# Patient Record
Sex: Female | Born: 1975 | Race: White | Hispanic: No | Marital: Single | Smoking: Never smoker
Health system: Southern US, Community
[De-identification: ages and names within clinical notes are randomized; demographics above are authoritative.]

## PROBLEM LIST (undated history)

## (undated) DIAGNOSIS — E282 Polycystic ovarian syndrome: Secondary | ICD-10-CM

## (undated) DIAGNOSIS — K219 Gastro-esophageal reflux disease without esophagitis: Secondary | ICD-10-CM

## (undated) DIAGNOSIS — R519 Headache, unspecified: Secondary | ICD-10-CM

## (undated) DIAGNOSIS — T7840XA Allergy, unspecified, initial encounter: Secondary | ICD-10-CM

## (undated) DIAGNOSIS — I959 Hypotension, unspecified: Secondary | ICD-10-CM

## (undated) DIAGNOSIS — R911 Solitary pulmonary nodule: Secondary | ICD-10-CM

## (undated) DIAGNOSIS — R200 Anesthesia of skin: Secondary | ICD-10-CM

## (undated) DIAGNOSIS — I2699 Other pulmonary embolism without acute cor pulmonale: Secondary | ICD-10-CM

## (undated) DIAGNOSIS — R51 Headache: Secondary | ICD-10-CM

## (undated) DIAGNOSIS — F419 Anxiety disorder, unspecified: Secondary | ICD-10-CM

## (undated) DIAGNOSIS — D649 Anemia, unspecified: Secondary | ICD-10-CM

## (undated) DIAGNOSIS — D259 Leiomyoma of uterus, unspecified: Secondary | ICD-10-CM

## (undated) DIAGNOSIS — R202 Paresthesia of skin: Secondary | ICD-10-CM

## (undated) DIAGNOSIS — M25571 Pain in right ankle and joints of right foot: Secondary | ICD-10-CM

## (undated) HISTORY — PX: EYE SURGERY: SHX253

## (undated) HISTORY — DX: Allergy, unspecified, initial encounter: T78.40XA

## (undated) HISTORY — DX: Anxiety disorder, unspecified: F41.9

## (undated) HISTORY — PX: TONSILLECTOMY: SUR1361

## (undated) HISTORY — PX: APPENDECTOMY: SHX54

---

## 2013-02-19 ENCOUNTER — Encounter (INDEPENDENT_AMBULATORY_CARE_PROVIDER_SITE_OTHER): Payer: Self-pay | Admitting: Ophthalmology

## 2013-02-19 DIAGNOSIS — H33309 Unspecified retinal break, unspecified eye: Secondary | ICD-10-CM

## 2013-02-19 DIAGNOSIS — H43819 Vitreous degeneration, unspecified eye: Secondary | ICD-10-CM

## 2013-02-19 DIAGNOSIS — H5210 Myopia, unspecified eye: Secondary | ICD-10-CM

## 2013-02-22 ENCOUNTER — Encounter (INDEPENDENT_AMBULATORY_CARE_PROVIDER_SITE_OTHER): Payer: Self-pay | Admitting: Ophthalmology

## 2013-02-22 DIAGNOSIS — H43819 Vitreous degeneration, unspecified eye: Secondary | ICD-10-CM

## 2013-02-22 DIAGNOSIS — H33309 Unspecified retinal break, unspecified eye: Secondary | ICD-10-CM

## 2013-03-05 ENCOUNTER — Ambulatory Visit (INDEPENDENT_AMBULATORY_CARE_PROVIDER_SITE_OTHER): Payer: Self-pay | Admitting: Ophthalmology

## 2013-06-25 ENCOUNTER — Ambulatory Visit (INDEPENDENT_AMBULATORY_CARE_PROVIDER_SITE_OTHER): Payer: Self-pay | Admitting: Ophthalmology

## 2013-07-13 ENCOUNTER — Ambulatory Visit (INDEPENDENT_AMBULATORY_CARE_PROVIDER_SITE_OTHER): Payer: Self-pay | Admitting: Ophthalmology

## 2013-08-08 ENCOUNTER — Ambulatory Visit (INDEPENDENT_AMBULATORY_CARE_PROVIDER_SITE_OTHER): Payer: BC Managed Care – PPO | Admitting: Ophthalmology

## 2013-08-08 DIAGNOSIS — H43819 Vitreous degeneration, unspecified eye: Secondary | ICD-10-CM

## 2013-08-08 DIAGNOSIS — H5210 Myopia, unspecified eye: Secondary | ICD-10-CM

## 2013-08-08 DIAGNOSIS — H33309 Unspecified retinal break, unspecified eye: Secondary | ICD-10-CM

## 2013-08-20 ENCOUNTER — Ambulatory Visit (INDEPENDENT_AMBULATORY_CARE_PROVIDER_SITE_OTHER): Payer: BC Managed Care – PPO | Admitting: Ophthalmology

## 2013-08-20 DIAGNOSIS — H33309 Unspecified retinal break, unspecified eye: Secondary | ICD-10-CM

## 2014-10-28 ENCOUNTER — Ambulatory Visit (INDEPENDENT_AMBULATORY_CARE_PROVIDER_SITE_OTHER): Payer: Self-pay | Admitting: Family Medicine

## 2014-10-28 ENCOUNTER — Ambulatory Visit (INDEPENDENT_AMBULATORY_CARE_PROVIDER_SITE_OTHER): Payer: Self-pay

## 2014-10-28 VITALS — BP 102/64 | HR 70 | Temp 98.2°F | Resp 16 | Ht 64.0 in | Wt 143.0 lb

## 2014-10-28 DIAGNOSIS — R0602 Shortness of breath: Secondary | ICD-10-CM

## 2014-10-28 MED ORDER — LEVOFLOXACIN 500 MG PO TABS: 500.0000 mg | ORAL_TABLET | Freq: Every day | ORAL | Status: DC

## 2014-10-28 MED ORDER — ALBUTEROL SULFATE HFA 108 (90 BASE) MCG/ACT IN AERS: 2.0000 | INHALATION_SPRAY | Freq: Four times a day (QID) | RESPIRATORY_TRACT | Status: DC | PRN

## 2014-10-28 MED ORDER — DOXYCYCLINE HYCLATE 100 MG PO TABS: 100.0000 mg | ORAL_TABLET | Freq: Two times a day (BID) | ORAL | Status: DC

## 2014-10-28 NOTE — Progress Notes (Signed)
Crystal Spencer is a 38 y.o. female who presents to Urgent Care today with complaints of dyspnea:    States dyspnea started about 2 weeks ago.  In retrospect, states really started about 1 year ago but only worsened roughly 2 weeks.  Dry hacking cough worse at night.  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  No chest pain.  Does endorse some anxiety and decreased appetite, decreased interest in activities.  No SI/HI.  Recently diagnosed with bronchitis after being seen at [facility].  Given 5 days of Azithromycin, finished that about 1 week ago.  PMH reviewed.  Past Medical History  Diagnosis Date  . Allergy   . Anxiety   . Hypertension    Past Surgical History  Procedure Laterality Date  . Eye surgery    . Appendectomy      Medications reviewed. Current Outpatient Prescriptions  Medication Sig Dispense Refill  . norgestimate-ethinyl estradiol (ORTHO-CYCLEN,SPRINTEC,PREVIFEM) 0.25-35 MG-MCG tablet Take 1 tablet by mouth daily.     No current facility-administered medications for this visit.    ROS as above otherwise neg.  No chest pain, palpitations, SOB, Fever, Chills, Abd pain, N/V/D.   Physical Exam:  BP 102/64 mmHg  Pulse 70  Temp(Src) 98.2 F (36.8 C) (Oral)  Resp 16  Ht 5\' 4"  (1.626 m)  Wt 143 lb (64.864 kg)  BMI 24.53 kg/m2  SpO2 100%  LMP 10/06/2014 Gen:  Alert, cooperative patient who appears stated age in no acute distress.  Vital signs reviewed. HEENT: EOMI,  MMM Pulm:  Clear to auscultation  Chest:  TTP along Right serratus anterior muscles.   Cardiac:  Regular rate and rhythm . Abd:  Nontender Ext:  No edema BL legs Psych:  Somewhat anxious appearing.  Denies SI/HI  Exam completed with clinic staff present for entire exam.   [facility] reading (PRIMARY) by  Dr. Mingo Amber:  Question of very faint right opacity RML.  Otherwise no acute cardiopulmonary disease.   Assessment and Plan:  1.  Dyspnea: - think this  is undiagnosed asthma - questionable shadow RML.  Not very likely PNA, but also with persistent cough and some subjective chills.   - treat with doxy.   - inhatler for presumed asthma.  - Needs PFTs  2.  Depression: - think there is likely component of depression as well - discussed this with her - defers treatment until next visit - FU in 2-3 weeks to assess for improvement/change.    j

## 2014-10-28 NOTE — Patient Instructions (Addendum)
Do NOT pick up the Slick.  Take the Doxycycline.  Use the inhaler before bed and every 6 hours if you need it.  It was good to meet you.

## 2014-11-04 ENCOUNTER — Telehealth: Payer: Self-pay | Admitting: General Practice

## 2014-11-04 NOTE — Telephone Encounter (Signed)
-----   Message from Alveda Reasons, MD sent at 10/30/2014  2:21 PM EST ----- Crystal Spencer,  This is a patient I saw at Urgent Care who needs a PCP.  Could you send her a new patient packet and also add my name as her PCP?    Thanks,  JW

## 2015-09-24 ENCOUNTER — Ambulatory Visit (INDEPENDENT_AMBULATORY_CARE_PROVIDER_SITE_OTHER): Payer: Self-pay | Admitting: Family Medicine

## 2015-09-24 VITALS — BP 108/68 | HR 74 | Temp 99.6°F | Resp 16 | Ht 64.0 in | Wt 156.0 lb

## 2015-09-24 DIAGNOSIS — J209 Acute bronchitis, unspecified: Secondary | ICD-10-CM

## 2015-09-24 MED ORDER — AZITHROMYCIN 250 MG PO TABS: ORAL_TABLET | ORAL | Status: DC

## 2015-09-24 MED ORDER — DOXYCYCLINE HYCLATE 100 MG PO TABS: 100.0000 mg | ORAL_TABLET | Freq: Two times a day (BID) | ORAL | Status: DC

## 2015-09-24 MED ORDER — LEVOFLOXACIN 500 MG PO TABS: 500.0000 mg | ORAL_TABLET | Freq: Every day | ORAL | Status: DC

## 2015-09-24 NOTE — Addendum Note (Signed)
Addended by: Robyn Haber on: 09/24/2015 03:22 PM   Modules accepted: Orders

## 2015-09-24 NOTE — Progress Notes (Signed)
@[facility] @  This chart was scribed for Robyn Haber, MD by Thea Alken, ED Scribe. This patient was seen in room 13 and the patient's care was started at 2:36 PM.  Patient ID: Crystal Spencer MRN: [id number], DOB: 1976-04-01, 39 y.o. Date of Encounter: 09/24/2015, 2:36 PM  Primary Physician: No PCP Per Patient  Chief Complaint:  Chief Complaint  Patient presents with   Sore Throat    x 6 days    Fever   Chest Pain    x 1 day, comes and goes    chills and hot    x 6 days    Cough    mucus comes up, yellowish little blood, x 6 days     HPI: 39 y.o. year old female with history below presents with productive cough. Pt states symptoms started 6 days ago with sore throat, fever, chills, severe lower back pain, productive cough and feeling fatigued. She started feeling better after resting the following day but began to have recurrent fever, rattling in right chest with breathing 3 days ago and throat irritation from cough and palpitation last night while laying in bed. She has been using honey herb recola cough drops and nyquil. She has hx of pneumonia, last year, and reports current symptoms to be similar. She has a left over inhaler at home. She denies hx of asthma.   She has been prescribed Levaquin in the past but states this medication is too expensive. She has taken doxycycline in the past but ended up developing an esophageal ulcer, due to not drink enough fluid with medication. She reports most recently while taking amoxicillin she developed a rash.  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  Pt is from [place].  Past Medical History  Diagnosis Date   Allergy    Anxiety    Hypertension      Home Meds: Prior to Admission medications   Medication Sig Start Date End Date Taking? Authorizing Provider  albuterol (PROVENTIL HFA;VENTOLIN HFA) 108 (90 BASE) MCG/ACT inhaler Inhale  2 puffs into the lungs every 6 (six) hours as needed for wheezing or shortness of breath. 10/28/14  Yes Alveda Reasons, MD    Allergies:  Allergies  Allergen Reactions   Amoxicillin Rash    Social History   Social History   Marital Status: Single    Spouse Name: N/A   Number of Children: N/A   Years of Education: N/A   Occupational History   Not on file.   Social History Main Topics   Smoking status: Never Smoker    Smokeless tobacco: Never Used   Alcohol Use: No   Drug Use: No   Sexual Activity: Not on file   Other Topics Concern   Not on file   Social History Narrative     Review of Systems: Constitutional: negative for night sweats, weight changes. HEENT: negative for vision changes, hearing loss, congestion, rhinorrhea, ST, epistaxis, or sinus pressure Cardiovascular: negative for chest pain or palpitations Respiratory: negative for hemoptysis, wheezing, shortness of breath, or cough Abdominal: negative for abdominal pain, nausea, vomiting, diarrhea, or constipation Dermatological: negative for rash Neurologic: negative for headache, dizziness, or syncope All other systems reviewed and are otherwise negative with the exception to those above and in the HPI.   Physical Exam Blood pressure 108/68, pulse 74, temperature 99.6 F (37.6 C), temperature source Oral, resp. rate 16, height 5\' 4"  (  1.626 m), weight 156 lb (70.761 kg), last menstrual period 08/01/2015, SpO2 98 %., Body mass index is 26.76 kg/(m^2). General: Well developed, well nourished, in no acute distress. Head: Normocephalic, atraumatic, eyes without discharge, sclera non-icteric, nares are without discharge. Bilateral auditory canals clear, TM's are without perforation, pearly grey and translucent with reflective cone of light bilaterally. Oral cavity moist, posterior pharynx without exudate, erythema, peritonsillar abscess, or post nasal drip.  Neck: Supple. No thyromegaly. Full ROM. No  lymphadenopathy. Lungs: Rhonchi in right anterior chest. Breathing is unlabored. Heart: RRR with S1 S2. No murmurs, rubs, or gallops appreciated. Abdomen: Soft, non-tender, non-distended with normoactive bowel sounds. No hepatomegaly. No rebound/guarding. No obvious abdominal masses. Msk:  Strength and tone normal for age. Extremities/Skin: Warm and dry. No clubbing or cyanosis. No edema. No rashes or suspicious lesions. Neuro: Alert and oriented X 3. Moves all extremities spontaneously. Gait is normal. CNII-XII grossly in tact. Psych:  Responds to questions appropriately with a normal affect.    ASSESSMENT AND PLAN:  39 y.o. year old female with acute bronchitis vs. Early pneumonia This chart was scribed in my presence and reviewed by me personally.     ICD-9-CM ICD-10-CM   1. Acute bronchitis, unspecified organism 466.0 J20.9 doxycycline (VIBRA-TABS) 100 MG tablet     levofloxacin (LEVAQUIN) 500 MG tablet   By signing my name below, I, Raven Small, attest that this documentation has been prepared under the direction and in the presence of Robyn Haber, MD.  Electronically Signed: Thea Alken, ED Scribe. 09/24/2015. 2:50 PM.  Signed, Robyn Haber, MD 09/24/2015 2:36 PM

## 2015-09-24 NOTE — Patient Instructions (Signed)

## 2016-05-23 ENCOUNTER — Emergency Department (HOSPITAL_COMMUNITY)
Admission: EM | Admit: 2016-05-23 | Discharge: 2016-05-24 | Disposition: A | Discharge status: Home / Self Care | Payer: Self-pay | Attending: Emergency Medicine | Admitting: Emergency Medicine

## 2016-05-23 ENCOUNTER — Emergency Department (HOSPITAL_COMMUNITY): Payer: Self-pay

## 2016-05-23 ENCOUNTER — Encounter (HOSPITAL_COMMUNITY): Payer: Self-pay | Admitting: *Deleted

## 2016-05-23 DIAGNOSIS — R1031 Right lower quadrant pain: Secondary | ICD-10-CM

## 2016-05-23 DIAGNOSIS — I10 Essential (primary) hypertension: Secondary | ICD-10-CM | POA: Insufficient documentation

## 2016-05-23 DIAGNOSIS — D259 Leiomyoma of uterus, unspecified: Secondary | ICD-10-CM | POA: Insufficient documentation

## 2016-05-23 DIAGNOSIS — Z791 Long term (current) use of non-steroidal anti-inflammatories (NSAID): Secondary | ICD-10-CM | POA: Insufficient documentation

## 2016-05-23 DIAGNOSIS — Z79899 Other long term (current) drug therapy: Secondary | ICD-10-CM | POA: Insufficient documentation

## 2016-05-23 LAB — URINALYSIS, ROUTINE W REFLEX MICROSCOPIC
Bilirubin Urine: NEGATIVE
Glucose, UA: NEGATIVE mg/dL
Hgb urine dipstick: NEGATIVE
Ketones, ur: 15 mg/dL — AB
Leukocytes, UA: NEGATIVE
Nitrite: NEGATIVE
PROTEIN: NEGATIVE mg/dL
SPECIFIC GRAVITY, URINE: 1.037 — AB (ref 1.005–1.030)
pH: 6 (ref 5.0–8.0)

## 2016-05-23 LAB — LIPASE, BLOOD: LIPASE: 22 U/L (ref 11–51)

## 2016-05-23 LAB — COMPREHENSIVE METABOLIC PANEL
ALT: 26 U/L (ref 14–54)
ANION GAP: 7 (ref 5–15)
AST: 22 U/L (ref 15–41)
Albumin: 4 g/dL (ref 3.5–5.0)
Alkaline Phosphatase: 80 U/L (ref 38–126)
BUN: 10 mg/dL (ref 6–20)
CHLORIDE: 105 mmol/L (ref 101–111)
CO2: 24 mmol/L (ref 22–32)
CREATININE: 0.53 mg/dL (ref 0.44–1.00)
Calcium: 9.6 mg/dL (ref 8.9–10.3)
Glucose, Bld: 99 mg/dL (ref 65–99)
POTASSIUM: 3.8 mmol/L (ref 3.5–5.1)
Sodium: 136 mmol/L (ref 135–145)
Total Bilirubin: 0.6 mg/dL (ref 0.3–1.2)
Total Protein: 7.9 g/dL (ref 6.5–8.1)

## 2016-05-23 LAB — CBC
HEMATOCRIT: 34.9 % — AB (ref 36.0–46.0)
Hemoglobin: 12.3 g/dL (ref 12.0–15.0)
MCH: 30.1 pg (ref 26.0–34.0)
MCHC: 35.2 g/dL (ref 30.0–36.0)
MCV: 85.5 fL (ref 78.0–100.0)
PLATELETS: 545 10*3/uL — AB (ref 150–400)
RBC: 4.08 MIL/uL (ref 3.87–5.11)
RDW: 12.9 % (ref 11.5–15.5)
WBC: 10.8 10*3/uL — AB (ref 4.0–10.5)

## 2016-05-23 LAB — POC URINE PREG, ED: PREG TEST UR: NEGATIVE

## 2016-05-23 NOTE — ED Notes (Addendum)
Pt complains of right sided abdominal that has gotten progressively worse since Friday. Pt has hx of polycystic ovarian syntdrome. Pt no longer has appendix. Pt last took ibuprofen at 5PM today. Pt denies n/v/d. Pt states she feels a mass around her RLQ. Pt states she has been unable to take medication for her polycystic ovarian syndrome for the past year.

## 2016-05-24 MED ORDER — HYDROCODONE-ACETAMINOPHEN 5-325 MG PO TABS: 1.0000 | ORAL_TABLET | ORAL | Status: DC | PRN

## 2016-05-24 NOTE — ED Provider Notes (Signed)
CSN: [id number]     Arrival date & time 05/23/16  2103 History   First MD Initiated Contact with Patient 05/23/16 2230     Chief Complaint  Patient presents with  . Abdominal Pain     (Consider location/radiation/quality/duration/timing/severity/associated sxs/prior Treatment) HPI Comments: 40 y.o female with history of PCOS presents for abdominal pain and concern for mass.  Patient states that she has noted for sometime that her lower belly has been expanding but she thought she was just gaining weight.  She says that since Friday she has had increasing abdominal discomfort more so on the right side.  Denies fever, chills, nausea, vomiting, diarrhea, constipation.  She does report that she feels that she has to urinate frequently and often only urinates a small amount - she feels her bladder cannot hold much urine.  She also reports that since yesterday she has noted a mass in her lower abdomen.  The patient is supposed to be on medication for her PCOS but is unemployed and uninsured and has not been able to see her doctor or pay for prescriptions.   Past Medical History  Diagnosis Date  . Allergy   . Anxiety   . Hypertension    Past Surgical History  Procedure Laterality Date  . Eye surgery    . Appendectomy     Family History  Problem Relation Age of Onset  . Hyperlipidemia Brother   . Cancer Maternal Grandmother   . Diabetes Maternal Grandmother   . Hearing loss Maternal Grandmother   . Heart disease Maternal Grandfather   . Stroke Maternal Grandfather   . Diabetes Paternal Grandmother   . Stroke Paternal Grandfather   . Mental retardation Paternal Grandfather    Social History  Substance Use Topics  . Smoking status: Never Smoker   . Smokeless tobacco: Never Used  . Alcohol Use: No   OB History    No data available     Review of Systems  Constitutional: Negative for fever, chills and fatigue.  HENT: Negative for congestion and postnasal drip.   Eyes: Negative for  visual disturbance.  Respiratory: Negative for cough and shortness of breath.   Cardiovascular: Negative for chest pain and leg swelling.  Gastrointestinal: Positive for abdominal pain. Negative for nausea, vomiting, diarrhea and constipation.  Genitourinary: Positive for frequency. Negative for dysuria, urgency and hematuria.  Musculoskeletal: Negative for myalgias and back pain.  Skin: Negative for wound.  Neurological: Negative for dizziness, light-headedness and headaches.  Hematological: Does not bruise/bleed easily.      Allergies  Amoxicillin  Home Medications   Prior to Admission medications   Medication Sig Start Date End Date Taking? Authorizing Provider  albuterol (PROVENTIL HFA;VENTOLIN HFA) 108 (90 BASE) MCG/ACT inhaler Inhale 2 puffs into the lungs every 6 (six) hours as needed for wheezing or shortness of breath. 10/28/14  Yes Alveda Reasons, MD  aspirin-sod bicarb-citric acid (ALKA-SELTZER) 325 MG TBEF tablet Take 325 mg by mouth every 6 (six) hours as needed (heart burn).   Yes Historical Provider, MD  ibuprofen (ADVIL,MOTRIN) 400 MG tablet Take 400 mg by mouth every 6 (six) hours as needed.   Yes Historical Provider, MD  loratadine (CLARITIN) 10 MG tablet Take 10 mg by mouth daily.   Yes Historical Provider, MD  Pseudoeph-Doxylamine-DM-APAP (NYQUIL PO) Take 2 capsules by mouth as needed (for sleep).   Yes Historical Provider, MD  azithromycin (ZITHROMAX) 250 MG tablet Take 2 tabs PO x 1 dose, then 1 tab PO QD  x 4 days 09/24/15   Robyn Haber, MD  doxycycline (VIBRA-TABS) 100 MG tablet Take 1 tablet (100 mg total) by mouth 2 (two) times daily. X 7 days 09/24/15   Robyn Haber, MD  levofloxacin (LEVAQUIN) 500 MG tablet Take 1 tablet (500 mg total) by mouth daily. 09/24/15   Robyn Haber, MD   BP 101/58 mmHg  Pulse 71  Temp(Src) 100.3 F (37.9 C) (Oral)  Resp 17  SpO2 97%  LMP 03/20/2016 Physical Exam  Constitutional: She is oriented to person, place,  and time. She appears well-developed and well-nourished. No distress.  HENT:  Head: Normocephalic and atraumatic.  Right Ear: External ear normal.  Left Ear: External ear normal.  Nose: Nose normal.  Mouth/Throat: Oropharynx is clear and moist. No oropharyngeal exudate.  Eyes: EOM are normal. Pupils are equal, round, and reactive to light.  Neck: Normal range of motion. Neck supple.  Cardiovascular: Normal rate, regular rhythm, normal heart sounds and intact distal pulses.   No murmur heard. Pulmonary/Chest: Effort normal. No respiratory distress. She has no wheezes. She has no rales.  Abdominal: Soft. She exhibits mass (in pelvis). She exhibits no distension. There is no tenderness.  Musculoskeletal: Normal range of motion. She exhibits no edema or tenderness.  Neurological: She is alert and oriented to person, place, and time.  Skin: Skin is warm and dry. No rash noted. She is not diaphoretic.  Vitals reviewed.   ED Course  Procedures (including critical care time) Labs Review Labs Reviewed  CBC - Abnormal; Notable for the following:    WBC 10.8 (*)    HCT 34.9 (*)    Platelets 545 (*)    All other components within normal limits  URINALYSIS, ROUTINE W REFLEX MICROSCOPIC (NOT AT [facility]) - Abnormal; Notable for the following:    Color, Urine AMBER (*)    Specific Gravity, Urine 1.037 (*)    Ketones, ur 15 (*)    All other components within normal limits  LIPASE, BLOOD  COMPREHENSIVE METABOLIC PANEL  POC URINE PREG, ED    Imaging Review US Transvaginal Non-ob  05/24/2016  CLINICAL DATA:  Initial evaluation for palpable mass at lower abdomen. Acute right lower quadrant pain. History of prior appendectomy. EXAM: TRANSABDOMINAL AND TRANSVAGINAL ULTRASOUND OF PELVIS TECHNIQUE: Both transabdominal and transvaginal ultrasound examinations of the pelvis were performed. Transabdominal technique was performed for global imaging of the pelvis including uterus, ovaries, adnexal regions,  and pelvic cul-de-sac. It was necessary to proceed with endovaginal exam following the transabdominal exam to visualize the uterus and ovaries. COMPARISON:  None FINDINGS: Uterus Measurements: 18.2 x 7.2 x 9.2 cm. Several fibroids were present. Largest measured 8.0 x 7.5 x 9.1 cm. A second fibroid measured 6.3 x 6.1 x 5.1 cm Endometrium Thickness: 22.6 mm.  No focal abnormality visualized. Right ovary Measurements: 4.9 x 2.3 x 3.7 cm. Normal appearance/no adnexal mass. Left ovary Measurements: 6.7 x 2.7 x 3.3 cm. Normal appearance/no adnexal mass. Other findings Trace free fluid, likely physiologic. IMPRESSION: 1. Enlarged fibroid uterus. 2. No other acute abnormality within the pelvis. Normal sonographic appearance of the ovaries. 3. Trace free fluid within the pelvis, likely physiologic. Electronically Signed   By: Jeannine Boga M.D.   On: 05/24/2016 00:27   US Pelvis Complete  05/24/2016  CLINICAL DATA:  Initial evaluation for palpable mass at lower abdomen. Acute right lower quadrant pain. History of prior appendectomy. EXAM: TRANSABDOMINAL AND TRANSVAGINAL ULTRASOUND OF PELVIS TECHNIQUE: Both transabdominal and transvaginal ultrasound examinations of the  pelvis were performed. Transabdominal technique was performed for global imaging of the pelvis including uterus, ovaries, adnexal regions, and pelvic cul-de-sac. It was necessary to proceed with endovaginal exam following the transabdominal exam to visualize the uterus and ovaries. COMPARISON:  None FINDINGS: Uterus Measurements: 18.2 x 7.2 x 9.2 cm. Several fibroids were present. Largest measured 8.0 x 7.5 x 9.1 cm. A second fibroid measured 6.3 x 6.1 x 5.1 cm Endometrium Thickness: 22.6 mm.  No focal abnormality visualized. Right ovary Measurements: 4.9 x 2.3 x 3.7 cm. Normal appearance/no adnexal mass. Left ovary Measurements: 6.7 x 2.7 x 3.3 cm. Normal appearance/no adnexal mass. Other findings Trace free fluid, likely physiologic. IMPRESSION: 1.  Enlarged fibroid uterus. 2. No other acute abnormality within the pelvis. Normal sonographic appearance of the ovaries. 3. Trace free fluid within the pelvis, likely physiologic. Electronically Signed   By: Jeannine Boga M.D.   On: 05/24/2016 00:27   I have personally reviewed and evaluated these images and lab results as part of my medical decision-making.   EKG Interpretation None      MDM  Patient was seen and evaluated in stable condition. Palpable mass/uterus. Labs unremarkable.  [place] with fibroid uterus.  Patient educated on results and need for OBGYN follow up.  Patient discharged home in stable condition with pain medication. Instruction to use ibuprofen, and obgyn referral. Final diagnoses:  RLQ abdominal pain    1. fibroid uterus    Harvel Quale, MD 05/25/16 [phone]

## 2016-05-24 NOTE — Discharge Instructions (Signed)
You were seen and evaluated tonight for your lower abdominal discomfort and mass. On-year-old for sound that showed that you have a fibroid uterus that is causing these symptoms. You can use Motrin for pain control. Please try to make a follow-up appointment with an OB/GYN.  Uterine Fibroids Uterine fibroids are tissue masses (tumors) that can develop in the womb (uterus). They are also called leiomyomas. This type of tumor is not cancerous (benign) and does not spread to other parts of the body outside of the pelvic area, which is between the hip bones. Occasionally, fibroids may develop in the fallopian tubes, in the cervix, or on the support structures (ligaments) that surround the uterus. You can have one or many fibroids. Fibroids can vary in size, weight, and where they grow in the uterus. Some can become quite large. Most fibroids do not require medical treatment. CAUSES A fibroid can develop when a single uterine cell keeps growing (replicating). Most cells in the human body have a control mechanism that keeps them from replicating without control. SIGNS AND SYMPTOMS Symptoms may include:   Heavy bleeding during your period.  Bleeding or spotting between periods.  Pelvic pain and pressure.  Bladder problems, such as needing to urinate more often (urinary frequency) or urgently.  Inability to reproduce offspring (infertility).  Miscarriages. DIAGNOSIS Uterine fibroids are diagnosed through a physical exam. Your health care provider may feel the lumpy tumors during a pelvic exam. Ultrasonography and an MRI may be done to determine the size, location, and number of fibroids. TREATMENT Treatment may include:  Watchful waiting. This involves getting the fibroid checked by your health care provider to see if it grows or shrinks. Follow your health care provider's recommendations for how often to have this checked.  Hormone medicines. These can be taken by mouth or given through an  intrauterine device (IUD).  Surgery.  Removing the fibroids (myomectomy) or the uterus (hysterectomy).  Removing blood supply to the fibroids (uterine artery embolization). If fibroids interfere with your fertility and you want to become pregnant, your health care provider may recommend having the fibroids removed.  HOME CARE INSTRUCTIONS  Keep all follow-up visits as directed by your health care provider. This is important.  Take medicines only as directed by your health care provider.  If you were prescribed a hormone treatment, take the hormone medicines exactly as directed.  Do not take aspirin, because it can cause bleeding.  Ask your health care provider about taking iron pills and increasing the amount of dark green, leafy vegetables in your diet. These actions can help to boost your blood iron levels, which may be affected by heavy menstrual bleeding.  Pay close attention to your period and tell your health care provider about any changes, such as:  Increased blood flow that requires you to use more pads or tampons than usual per month.  A change in the number of days that your period lasts per month.  A change in symptoms that are associated with your period, such as abdominal cramping or back pain. SEEK MEDICAL CARE IF:  You have pelvic pain, back pain, or abdominal cramps that cannot be controlled with medicines.  You have an increase in bleeding between and during periods.  You soak tampons or pads in a half hour or less.  You feel lightheaded, extra tired, or weak. SEEK IMMEDIATE MEDICAL CARE IF:  You faint.  You have a sudden increase in pelvic pain.   This information is not intended to replace  advice given to you by your health care provider. Make sure you discuss any questions you have with your health care provider.   Document Released: 11/19/2000 Document Revised: 12/13/2014 Document Reviewed: 05/21/2014 Elsevier Interactive Patient Education [employer]

## 2016-06-03 ENCOUNTER — Encounter: Payer: Self-pay | Admitting: Obstetrics & Gynecology

## 2016-06-03 ENCOUNTER — Ambulatory Visit (INDEPENDENT_AMBULATORY_CARE_PROVIDER_SITE_OTHER): Payer: Self-pay | Admitting: Obstetrics & Gynecology

## 2016-06-03 VITALS — BP 104/60 | HR 72 | Ht 64.0 in | Wt 159.0 lb

## 2016-06-03 DIAGNOSIS — D259 Leiomyoma of uterus, unspecified: Secondary | ICD-10-CM

## 2016-06-03 NOTE — Patient Instructions (Addendum)
Uterine Fibroids Uterine fibroids are tissue masses (tumors) that can develop in the womb (uterus). They are also called leiomyomas. This type of tumor is not cancerous (benign) and does not spread to other parts of the body outside of the pelvic area, which is between the hip bones. Occasionally, fibroids may develop in the fallopian tubes, in the cervix, or on the support structures (ligaments) that surround the uterus. You can have one or many fibroids. Fibroids can vary in size, weight, and where they grow in the uterus. Some can become quite large. Most fibroids do not require medical treatment. CAUSES A fibroid can develop when a single uterine cell keeps growing (replicating). Most cells in the human body have a control mechanism that keeps them from replicating without control. SIGNS AND SYMPTOMS Symptoms may include:   Heavy bleeding during your period.  Bleeding or spotting between periods.  Pelvic pain and pressure.  Bladder problems, such as needing to urinate more often (urinary frequency) or urgently.  Inability to reproduce offspring (infertility).  Miscarriages. DIAGNOSIS Uterine fibroids are diagnosed through a physical exam. Your health care provider may feel the lumpy tumors during a pelvic exam. Ultrasonography and an MRI may be done to determine the size, location, and number of fibroids. TREATMENT Treatment may include:  Watchful waiting. This involves getting the fibroid checked by your health care provider to see if it grows or shrinks. Follow your health care provider's recommendations for how often to have this checked.  Hormone medicines. These can be taken by mouth or given through an intrauterine device (IUD).  Surgery.  Removing the fibroids (myomectomy) or the uterus (hysterectomy).  Removing blood supply to the fibroids (uterine artery embolization). If fibroids interfere with your fertility and you want to become pregnant, your health care provider  may recommend having the fibroids removed.  HOME CARE INSTRUCTIONS  Keep all follow-up visits as directed by your health care provider. This is important.  Take medicines only as directed by your health care provider.  If you were prescribed a hormone treatment, take the hormone medicines exactly as directed.  Do not take aspirin, because it can cause bleeding.  Ask your health care provider about taking iron pills and increasing the amount of dark green, leafy vegetables in your diet. These actions can help to boost your blood iron levels, which may be affected by heavy menstrual bleeding.  Pay close attention to your period and tell your health care provider about any changes, such as:  Increased blood flow that requires you to use more pads or tampons than usual per month.  A change in the number of days that your period lasts per month.  A change in symptoms that are associated with your period, such as abdominal cramping or back pain. SEEK MEDICAL CARE IF:  You have pelvic pain, back pain, or abdominal cramps that cannot be controlled with medicines.  You have an increase in bleeding between and during periods.  You soak tampons or pads in a half hour or less.  You feel lightheaded, extra tired, or weak. SEEK IMMEDIATE MEDICAL CARE IF:  You faint.  You have a sudden increase in pelvic pain.   This information is not intended to replace advice given to you by your health care provider. Make sure you discuss any questions you have with your health care provider.   Document Released: 11/19/2000 Document Revised: 12/13/2014 Document Reviewed: 05/21/2014 Elsevier Interactive Patient Education 2016 Elsevier Inc.  

## 2016-06-03 NOTE — Progress Notes (Signed)
Patient ID: Crystal Spencer, female   DOB: 1975-12-27, 40 y.o.   MRN: [id number] History:  40 y.o. G0. LMP here today for Pt was referred for eval of uterine fibroids.  Pt was begin seen for PCOS by her doc in [place], Dr. Narda Bonds.  Pt reports that she has noted that her abd is growing.  She was having pain and was seen in the ED and she was noted to have uterine fibroids. She was seen at the [facility] and was given OPCs and it has helped with the pain. Pt reports very large clots with her cycle.  Pt denies bleeding between cycles.  Pt reports that she has no desire to conceive.    The following portions of the patient's history were reviewed and updated as appropriate: allergies, current medications, past family history, past medical history, past social history, past surgical history and problem list.  Review of Systems:  Pertinent items are noted in HPI.  Objective:  Physical Exam Last menstrual period 03/20/2016. Gen: NAD Abd: Soft, nontender and nondistended Pelvic: Normal appearing external genitalia; normal appearing vaginal mucosa and cervix.  Normal discharge.  Enlarged uterus 18-20 weeks sized.  Mobile.  No other palpable masses but, exam limited by mass.  Labs and Imaging US Transvaginal Non-ob  05/24/2016  CLINICAL DATA:  Initial evaluation for palpable mass at lower abdomen. Acute right lower quadrant pain. History of prior appendectomy. EXAM: TRANSABDOMINAL AND TRANSVAGINAL ULTRASOUND OF PELVIS TECHNIQUE: Both transabdominal and transvaginal ultrasound examinations of the pelvis were performed. Transabdominal technique was performed for global imaging of the pelvis including uterus, ovaries, adnexal regions, and pelvic cul-de-sac. It was necessary to proceed with endovaginal exam following the transabdominal exam to visualize the uterus and ovaries. COMPARISON:  None FINDINGS: Uterus Measurements: 18.2 x 7.2 x 9.2 cm. Several fibroids were present. Largest measured 8.0 x 7.5 x 9.1 cm. A  second fibroid measured 6.3 x 6.1 x 5.1 cm Endometrium Thickness: 22.6 mm.  No focal abnormality visualized. Right ovary Measurements: 4.9 x 2.3 x 3.7 cm. Normal appearance/no adnexal mass. Left ovary Measurements: 6.7 x 2.7 x 3.3 cm. Normal appearance/no adnexal mass. Other findings Trace free fluid, likely physiologic. IMPRESSION: 1. Enlarged fibroid uterus. 2. No other acute abnormality within the pelvis. Normal sonographic appearance of the ovaries. 3. Trace free fluid within the pelvis, likely physiologic. Electronically Signed   By: Jeannine Boga M.D.   On: 05/24/2016 00:27   US Pelvis Complete  05/24/2016  CLINICAL DATA:  Initial evaluation for palpable mass at lower abdomen. Acute right lower quadrant pain. History of prior appendectomy. EXAM: TRANSABDOMINAL AND TRANSVAGINAL ULTRASOUND OF PELVIS TECHNIQUE: Both transabdominal and transvaginal ultrasound examinations of the pelvis were performed. Transabdominal technique was performed for global imaging of the pelvis including uterus, ovaries, adnexal regions, and pelvic cul-de-sac. It was necessary to proceed with endovaginal exam following the transabdominal exam to visualize the uterus and ovaries. COMPARISON:  None FINDINGS: Uterus Measurements: 18.2 x 7.2 x 9.2 cm. Several fibroids were present. Largest measured 8.0 x 7.5 x 9.1 cm. A second fibroid measured 6.3 x 6.1 x 5.1 cm Endometrium Thickness: 22.6 mm.  No focal abnormality visualized. Right ovary Measurements: 4.9 x 2.3 x 3.7 cm. Normal appearance/no adnexal mass. Left ovary Measurements: 6.7 x 2.7 x 3.3 cm. Normal appearance/no adnexal mass. Other findings Trace free fluid, likely physiologic. IMPRESSION: 1. Enlarged fibroid uterus. 2. No other acute abnormality within the pelvis. Normal sonographic appearance of the ovaries. 3. Trace free fluid within the pelvis,  likely physiologic. Electronically Signed   By: Jeannine Boga M.D.   On: 05/24/2016 00:27    Assessment & Plan:   Uterine fibroids.  18-20 weeks sized.  Reviewed treatment options and pt wants surgery.  Want to review prev records to see how rapidly these are enlarging.  Pt does not report a h/o uterine fiborids but was followed prev in [place] and reports a sono 2 years prev   Keep OCPs  Records Dr. Narda Bonds in [place] and [place]  Complete financial aid paperwork  F/u 4-6 weeks or sooner prn   Dasani Thurlow L. Harraway-Smith, M.D., Cherlynn June

## 2016-06-04 ENCOUNTER — Encounter: Payer: Self-pay | Admitting: Obstetrics & Gynecology

## 2016-12-08 ENCOUNTER — Encounter: Payer: Self-pay | Admitting: Obstetrics & Gynecology

## 2016-12-08 ENCOUNTER — Ambulatory Visit (INDEPENDENT_AMBULATORY_CARE_PROVIDER_SITE_OTHER): Payer: BLUE CROSS/BLUE SHIELD | Admitting: Obstetrics & Gynecology

## 2016-12-08 VITALS — BP 114/67 | HR 73 | Ht 64.0 in | Wt 160.4 lb

## 2016-12-08 DIAGNOSIS — D251 Intramural leiomyoma of uterus: Secondary | ICD-10-CM

## 2016-12-08 DIAGNOSIS — D250 Submucous leiomyoma of uterus: Secondary | ICD-10-CM | POA: Diagnosis not present

## 2016-12-08 NOTE — Patient Instructions (Addendum)
Total Laparoscopic Hysterectomy A total laparoscopic hysterectomy is a minimally invasive surgery to remove your uterus and cervix. This surgery is performed by making several small cuts (incisions) in your abdomen. It can also be done with a thin, lighted tube (laparoscope) inserted into two small incisions in your lower abdomen. Your fallopian tubes and ovaries can be removed (bilateral salpingo-oophorectomy) during this surgery as well.Benefits of minimally invasive surgery include:  Less pain.  Less risk of blood loss.  Less risk of infection.  Quicker return to normal activities.  Tell a health care provider about:  Any allergies you have.  All medicines you are taking, including vitamins, herbs, eye drops, creams, and over-the-counter medicines.  Any problems you or family members have had with anesthetic medicines.  Any blood disorders you have.  Any surgeries you have had.  Any medical conditions you have. What are the risks? Generally, this is a safe procedure. However, as with any procedure, complications can occur. Possible complications include:  Bleeding.  Blood clots in the legs or lung.  Infection.  Injury to surrounding organs.  Problems with anesthesia.  Early menopause symptoms (hot flashes, night sweats, insomnia).  Risk of conversion to an open abdominal incision.  What happens before the procedure?  Ask your health care provider about changing or stopping your regular medicines.  Do not take aspirin or blood thinners (anticoagulants) for 1 week before the surgery or as told by your health care provider.  Do not eat or drink anything for 8 hours before the surgery or as told by your health care provider.  Quit smoking if you smoke.  Arrange for a ride home after surgery and for someone to help you at home during recovery. What happens during the procedure?  You will be given antibiotic medicine.  An IV tube will be placed in your arm. You  will be given medicine to make you sleep (general anesthetic).  A gas (carbon dioxide) will be used to inflate your abdomen. This will allow your surgeon to look inside your abdomen, perform your surgery, and treat any other problems found if necessary.  Three or four small incisions (often less than 1/2 inch) will be made in your abdomen. One of these incisions will be made in the area of your belly button (navel). The laparoscope will be inserted into the incision. Your surgeon will look through the laparoscope while doing your procedure.  Other surgical instruments will be inserted through the other incisions.  Your uterus may be removed through your vagina or cut into small pieces and removed through the small incisions.  Your incisions will be closed. What happens after the procedure?  The gas will be released from inside your abdomen.  You will be taken to the recovery area where a nurse will watch and check your progress. Once you are awake, stable, and taking fluids well, without other problems, you will return to your room or be allowed to go home.  There is usually minimal discomfort following the surgery because the incisions are so small.  You will be given pain medicine while you are in the hospital and for when you go home. This information is not intended to replace advice given to you by your health care provider. Make sure you discuss any questions you have with your health care provider. Document Released: 09/19/2007 Document Revised: 04/29/2016 Document Reviewed: 06/12/2013 Elsevier Interactive Patient Education  2017 Elsevier Inc. Total Laparoscopic Hysterectomy, Care After Refer to this sheet in the next few   weeks. These instructions provide you with information on caring for yourself after your procedure. Your health care provider may also give you more specific instructions. Your treatment has been planned according to current medical practices, but problems sometimes  occur. Call your health care provider if you have any problems or questions after your procedure. What can I expect after the procedure?  Pain and bruising at the incision sites. You will be given pain medicine to control it.  Menopausal symptoms such as hot flashes, night sweats, and insomnia if your ovaries were removed.  Sore throat from the breathing tube that was inserted during surgery. Follow these instructions at home:  Only take over-the-counter or prescription medicines for pain, discomfort, or fever as directed by your health care provider.  Do not take aspirin. It can cause bleeding.  Do not drive when taking pain medicine.  Follow your health care provider's advice regarding diet, exercise, lifting, driving, and general activities.  Resume your usual diet as directed and allowed.  Get plenty of rest and sleep.  Do not douche, use tampons, or have sexual intercourse for at least 6 weeks, or until your health care provider gives you permission.  Change your bandages (dressings) as directed by your health care provider.  Monitor your temperature and notify your health care provider of a fever.  Take showers instead of baths for 2-3 weeks.  Do not drink alcohol until your health care provider gives you permission.  If you develop constipation, you may take a mild laxative with your health care provider's permission. Bran foods may help with constipation problems. Drinking enough fluids to keep your urine clear or pale yellow may help as well.  Try to have someone home with you for 1-2 weeks to help around the house.  Keep all of your follow-up appointments as directed by your health care provider. Contact a health care provider if:  You have swelling, redness, or increasing pain around your incision sites.  You have pus coming from your incision.  You notice a bad smell coming from your incision.  Your incision breaks open.  You feel dizzy or  lightheaded.  You have pain or bleeding when you urinate.  You have persistent diarrhea.  You have persistent nausea and vomiting.  You have abnormal vaginal discharge.  You have a rash.  You have any type of abnormal reaction or develop an allergy to your medicine.  You have poor pain control with your prescribed medicine. Get help right away if:  You have chest pain or shortness of breath.  You have severe abdominal pain that is not relieved with pain medicine.  You have pain or swelling in your legs. This information is not intended to replace advice given to you by your health care provider. Make sure you discuss any questions you have with your health care provider. Document Released: 09/12/2013 Document Revised: 04/29/2016 Document Reviewed: 06/12/2013 Elsevier Interactive Patient Education  2017 Elsevier Inc.  

## 2016-12-08 NOTE — Progress Notes (Signed)
History:  41 y.o. G0P0000 here today for discussion of management of fibroid uterus. Pt reports continued pressure and bleeding She reports that the bleeding is better on OCPs. She also c/o pressure on her bladder and freq urination due to the fibroids. NO dysuria or hematuria. Pt requests definitive tx with a hysterectomy. She reports that she is not interested in a myomectomy as she does not want children after age 71 and she is not in a relationship at present.  She wants to maintain her ovaries. She has a new job as a Surveyor, [employer] of 2 girls. She does no lifting at work presently.    The following portions of the patient's history were reviewed and updated as appropriate: allergies, current medications, past family history, past medical history, past social history, past surgical history and problem list.  Review of Systems:  Pertinent items are noted in HPI.   Objective:  Physical Exam Blood pressure 114/67, pulse 73, height 5\' 4"  (1.626 m), weight 160 lb 6.4 oz (72.8 kg), last menstrual period 12/04/2016. BP 114/67   Pulse 73   Ht 5\' 4"  (1.626 m)   Wt 160 lb 6.4 oz (72.8 kg)   LMP 12/04/2016 (Exact Date)   BMI 27.53 kg/m  CONSTITUTIONAL: Well-developed, well-nourished female in no acute distress.  HENT:  Normocephalic, atraumatic EYES: Conjunctivae and EOM are normal. No scleral icterus.  NECK: Normal range of motion SKIN: Skin is warm and dry. No rash noted. Not diaphoretic.No pallor. [place]: Alert and oriented to person, place, and time. Normal coordination.  Abd: Soft, nontender and nondistended; Fibroid uterus to 2 cm below umbilicus. Pelvic: deferred  Labs and Imaging 05/24/2016 CLINICAL DATA:  Initial evaluation for palpable mass at lower abdomen. Acute right lower quadrant pain. History of prior appendectomy.  EXAM: TRANSABDOMINAL AND TRANSVAGINAL ULTRASOUND OF PELVIS  TECHNIQUE: Both transabdominal and transvaginal ultrasound examinations of the pelvis were performed.  Transabdominal technique was performed for global imaging of the pelvis including uterus, ovaries, adnexal regions, and pelvic cul-de-sac. It was necessary to proceed with endovaginal exam following the transabdominal exam to visualize the uterus and ovaries.  COMPARISON:  None  FINDINGS: Uterus  Measurements: 18.2 x 7.2 x 9.2 cm. Several fibroids were present. Largest measured 8.0 x 7.5 x 9.1 cm. A second fibroid measured 6.3 x 6.1 x 5.1 cm  Endometrium  Thickness: 22.6 mm.  No focal abnormality visualized.  Right ovary  Measurements: 4.9 x 2.3 x 3.7 cm. Normal appearance/no adnexal mass.  Left ovary  Measurements: 6.7 x 2.7 x 3.3 cm. Normal appearance/no adnexal mass.  Other findings  Trace free fluid, likely physiologic.  IMPRESSION: 1. Enlarged fibroid uterus. 2. No other acute abnormality within the pelvis. Normal sonographic appearance of the ovaries. 3. Trace free fluid within the pelvis, likely physiologic.  Assessment & Plan:  Large symptomatic uterine fibroids  Patient desires surgical management with robot assisted totla laparoscopic hysterectomy with bilateral salpingectomy.  The risks of surgery were discussed in detail with the patient including but not limited to: bleeding which may require transfusion or reoperation; infection which may require prolonged hospitalization or re-hospitalization and antibiotic therapy; injury to bowel, bladder, ureters and major vessels or other surrounding organs; need for additional procedures including laparotomy; thromboembolic phenomenon, incisional problems and other postoperative or anesthesia complications.  Patient was told that the likelihood that her condition and symptoms will be treated effectively with this surgical management was very high; the postoperative expectations were also discussed in detail. The patient also understands the  alternative treatment options which were discussed in full. All  questions were answered.  She was told that she will be contacted by our surgical scheduler regarding the time and date of her surgery; routine preoperative instructions of having nothing to eat or drink after midnight on the day prior to surgery and also coming to the hospital 1 1/2 hours prior to her time of surgery were also emphasized.  She was told she may be called for a preoperative appointment about a week prior to surgery and will be given further preoperative instructions at that visit. Printed patient education handouts about the procedure were given to the patient to review at home. Pt wants surgery AFTER Feb 5th  Jalisia Puchalski L. Harraway-Smith, M.D., Cherlynn June

## 2016-12-10 ENCOUNTER — Encounter (HOSPITAL_COMMUNITY): Payer: Self-pay | Admitting: *Deleted

## 2016-12-29 ENCOUNTER — Emergency Department (HOSPITAL_COMMUNITY): Payer: BLUE CROSS/BLUE SHIELD

## 2016-12-29 ENCOUNTER — Encounter (HOSPITAL_COMMUNITY): Payer: Self-pay | Admitting: *Deleted

## 2016-12-29 ENCOUNTER — Inpatient Hospital Stay (HOSPITAL_COMMUNITY)
Admission: EM | Admit: 2016-12-29 | Discharge: 2016-12-31 | DRG: 175 | Disposition: A | Discharge status: Home / Self Care | Payer: BLUE CROSS/BLUE SHIELD | Attending: Internal Medicine | Admitting: Internal Medicine

## 2016-12-29 DIAGNOSIS — I2699 Other pulmonary embolism without acute cor pulmonale: Secondary | ICD-10-CM

## 2016-12-29 DIAGNOSIS — J329 Chronic sinusitis, unspecified: Secondary | ICD-10-CM | POA: Diagnosis present

## 2016-12-29 DIAGNOSIS — D259 Leiomyoma of uterus, unspecified: Secondary | ICD-10-CM | POA: Diagnosis not present

## 2016-12-29 DIAGNOSIS — R0602 Shortness of breath: Secondary | ICD-10-CM | POA: Diagnosis not present

## 2016-12-29 DIAGNOSIS — F411 Generalized anxiety disorder: Secondary | ICD-10-CM | POA: Diagnosis not present

## 2016-12-29 DIAGNOSIS — I82491 Acute embolism and thrombosis of other specified deep vein of right lower extremity: Secondary | ICD-10-CM | POA: Diagnosis present

## 2016-12-29 DIAGNOSIS — Z881 Allergy status to other antibiotic agents status: Secondary | ICD-10-CM

## 2016-12-29 DIAGNOSIS — F419 Anxiety disorder, unspecified: Secondary | ICD-10-CM | POA: Diagnosis present

## 2016-12-29 DIAGNOSIS — I2609 Other pulmonary embolism with acute cor pulmonale: Secondary | ICD-10-CM

## 2016-12-29 DIAGNOSIS — R0789 Other chest pain: Secondary | ICD-10-CM | POA: Diagnosis not present

## 2016-12-29 DIAGNOSIS — Z885 Allergy status to narcotic agent status: Secondary | ICD-10-CM

## 2016-12-29 DIAGNOSIS — I82401 Acute embolism and thrombosis of unspecified deep veins of right lower extremity: Secondary | ICD-10-CM | POA: Diagnosis not present

## 2016-12-29 DIAGNOSIS — E282 Polycystic ovarian syndrome: Secondary | ICD-10-CM | POA: Diagnosis not present

## 2016-12-29 DIAGNOSIS — Z793 Long term (current) use of hormonal contraceptives: Secondary | ICD-10-CM

## 2016-12-29 DIAGNOSIS — R9431 Abnormal electrocardiogram [ECG] [EKG]: Secondary | ICD-10-CM | POA: Diagnosis not present

## 2016-12-29 DIAGNOSIS — I824Z1 Acute embolism and thrombosis of unspecified deep veins of right distal lower extremity: Secondary | ICD-10-CM | POA: Diagnosis not present

## 2016-12-29 HISTORY — DX: Other pulmonary embolism without acute cor pulmonale: I26.99

## 2016-12-29 HISTORY — DX: Leiomyoma of uterus, unspecified: D25.9

## 2016-12-29 HISTORY — DX: Polycystic ovarian syndrome: E28.2

## 2016-12-29 LAB — I-STAT TROPONIN, ED
TROPONIN I, POC: 0.01 ng/mL (ref 0.00–0.08)
Troponin i, poc: 0 ng/mL (ref 0.00–0.08)

## 2016-12-29 LAB — BASIC METABOLIC PANEL
ANION GAP: 7 (ref 5–15)
BUN: 6 mg/dL (ref 6–20)
CALCIUM: 9.2 mg/dL (ref 8.9–10.3)
CO2: 23 mmol/L (ref 22–32)
Chloride: 108 mmol/L (ref 101–111)
Creatinine, Ser: 0.62 mg/dL (ref 0.44–1.00)
GFR calc Af Amer: >60 mL/min (ref >60)
GLUCOSE: 95 mg/dL (ref 65–99)
Potassium: 3.9 mmol/L (ref 3.5–5.1)
Sodium: 138 mmol/L (ref 135–145)

## 2016-12-29 LAB — CBC
HCT: 34.2 % — ABNORMAL LOW (ref 36.0–46.0)
HEMOGLOBIN: 11.5 g/dL — AB (ref 12.0–15.0)
MCH: 28 pg (ref 26.0–34.0)
MCHC: 33.6 g/dL (ref 30.0–36.0)
MCV: 83.2 fL (ref 78.0–100.0)
Platelets: 381 10*3/uL (ref 150–400)
RBC: 4.11 MIL/uL (ref 3.87–5.11)
RDW: 14.5 % (ref 11.5–15.5)
WBC: 10.7 10*3/uL — ABNORMAL HIGH (ref 4.0–10.5)

## 2016-12-29 MED ORDER — HEPARIN (PORCINE) IN NACL 100-0.45 UNIT/ML-% IJ SOLN
1450.0000 [IU]/h | INTRAMUSCULAR | Status: DC
  Administered 2016-12-29: 1200 [IU]/h via INTRAVENOUS
  Administered 2016-12-30: 1450 [IU]/h via INTRAVENOUS
  Filled 2016-12-29 (×3): qty 250

## 2016-12-29 MED ORDER — IOPAMIDOL (ISOVUE-370) INJECTION 76%
INTRAVENOUS | Status: AC
  Administered 2016-12-29: 100 mL
  Filled 2016-12-29: qty 100

## 2016-12-29 MED ORDER — HEPARIN BOLUS VIA INFUSION
4000.0000 [IU] | Freq: Once | INTRAVENOUS | Status: AC
  Administered 2016-12-29: 4000 [IU] via INTRAVENOUS
  Filled 2016-12-29: qty 4000

## 2016-12-29 NOTE — Progress Notes (Signed)
ANTICOAGULATION CONSULT NOTE - Initial Consult  Pharmacy Consult for heparin Indication: pulmonary embolus  Allergies  Allergen Reactions  . Hydrocodone Nausea And Vomiting  . Amoxicillin Rash    Patient Measurements: Height: 5\' 4"  (162.6 cm) Weight: 160 lb (72.6 kg) IBW/kg (Calculated) : 54.7 Heparin Dosing Weight: 70kg  Vital Signs: Temp: 99.2 F (37.3 C) (01/24 1551) Temp Source: Oral (01/24 1551) BP: 119/83 (01/24 2200) Pulse Rate: 88 (01/24 2200)  Labs:  Recent Labs  12/29/16 1554  HGB 11.5*  HCT 34.2*  PLT 381  CREATININE 0.62    Estimated Creatinine Clearance: 92.3 mL/min (by C-G formula based on SCr of 0.62 mg/dL).   Medical History: Past Medical History:  Diagnosis Date  . Allergy   . Anxiety   . Polycystic ovarian syndrome   . Uterine fibroid     Medications:  Infusions:  . heparin      Assessment: 57 yof presented to the ED with SOB and chest tightness. CT demonstrated bilateral PE with right heart strain. Baseline H/H is slightly low and platelets are WNL. She is not on anticoagulation PTA.   Goal of Therapy:  Heparin level 0.3-0.7 units/ml Monitor platelets by anticoagulation protocol: Yes   Plan:  Heparin bolus 4000 units IV x 1 Heparin gtt 1200 units/hr Check a 6 hr heparin level Daily heparin level and CBC F/u plans for long-term anticoagulation.  Kysen Wetherington, Rande Lawman 12/29/2016,10:28 PM

## 2016-12-29 NOTE — ED Notes (Signed)
Patient transported to CT 

## 2016-12-29 NOTE — H&P (Signed)
History and Physical    Crystal Spencer [id number] DOB: 20-Jun-1976 DOA: 12/29/2016   PCP: No PCP Per Patient Chief Complaint:  Chief Complaint  Patient presents with  . Shortness of Breath    HPI: Crystal Spencer is a 41 y.o. female with medical history significant of PCOS, uterine fibroid.  Patient on OCP.  Patient presents to the ED with 4 day history of SOB and chest tightness.  Symptoms worsening.  No recent travel but has had RLE pain recently.  Nothing tried for symptoms, nothing makes better or worse.  Went to UC for symptoms and got sent to ED for abnormal EKG.  ED Course: EKG shows S1, Q3, QAVF, but upright T waves in lead 3.  Still with 2x negative troponins this was enough for EDP to perform CTA PE study which was positive for PE with R heart strain.  Review of Systems: As per HPI otherwise 10 point review of systems negative.    Past Medical History:  Diagnosis Date  . Allergy   . Anxiety   . Polycystic ovarian syndrome   . Uterine fibroid     Past Surgical History:  Procedure Laterality Date  . APPENDECTOMY    . EYE SURGERY       reports that she has never smoked. She has never used smokeless tobacco. She reports that she drinks alcohol. She reports that she does not use drugs.  Allergies  Allergen Reactions  . Hydrocodone Nausea And Vomiting  . Amoxicillin Rash    Family History  Problem Relation Age of Onset  . Hyperlipidemia Brother   . Cancer Maternal Grandmother   . Diabetes Maternal Grandmother   . Hearing loss Maternal Grandmother   . Heart disease Maternal Grandfather   . Stroke Maternal Grandfather   . Diabetes Paternal Grandmother   . Stroke Paternal Grandfather   . Mental retardation Paternal Grandfather       Prior to Admission medications   Medication Sig Start Date End Date Taking? Authorizing Provider  albuterol (PROVENTIL HFA;VENTOLIN HFA) 108 (90 BASE) MCG/ACT inhaler Inhale 2 puffs into the lungs every 6 (six) hours as needed  for wheezing or shortness of breath. 10/28/14  Yes Alveda Reasons, MD  ibuprofen (ADVIL,MOTRIN) 400 MG tablet Take 400 mg by mouth every 6 (six) hours as needed.   Yes Historical Provider, MD  loratadine (CLARITIN) 10 MG tablet Take 10 mg by mouth daily.   Yes Historical Provider, MD  Norgestimate-Ethinyl Estradiol Triphasic (ORTHO TRI-CYCLEN, 28,) 0.18/0.215/0.25 MG-35 MCG tablet Take 1 tablet by mouth daily.   Yes Historical Provider, MD    Physical Exam: Vitals:   12/29/16 2200 12/29/16 2230 12/29/16 2300 12/29/16 2330  BP: 119/83 117/90 115/91 119/87  Pulse: 88 91 100 91  Resp: 15 22 23 23   Temp:      TempSrc:      SpO2: 97% 97% 95% 96%  Weight:      Height:          Constitutional: NAD, calm, comfortable Eyes: PERRL, lids and conjunctivae normal ENMT: Mucous membranes are moist. Posterior pharynx clear of any exudate or lesions.Normal dentition.  Neck: normal, supple, no masses, no thyromegaly Respiratory: clear to auscultation bilaterally, no wheezing, no crackles. Normal respiratory effort. No accessory muscle use.  Cardiovascular: Regular rate and rhythm, no murmurs / rubs / gallops. No extremity edema. 2+ pedal pulses. No carotid bruits.  Abdomen: no tenderness, no masses palpated. No hepatosplenomegaly. Bowel sounds positive.  Musculoskeletal: no clubbing / cyanosis.  No joint deformity upper and lower extremities. Good ROM, no contractures. Normal muscle tone.  Skin: no rashes, lesions, ulcers. No induration Neurologic: CN 2-12 grossly intact. Sensation intact, DTR normal. Strength 5/5 in all 4.  Psychiatric: Normal judgment and insight. Alert and oriented x 3. Normal mood.    Labs on Admission: I have personally reviewed following labs and imaging studies  CBC:  Recent Labs Lab 12/29/16 1554  WBC 10.7*  HGB 11.5*  HCT 34.2*  MCV 83.2  PLT 123XX123   Basic Metabolic Panel:  Recent Labs Lab 12/29/16 1554  NA 138  K 3.9  CL 108  CO2 23  GLUCOSE 95  BUN 6    CREATININE 0.62  CALCIUM 9.2   GFR: Estimated Creatinine Clearance: 92.3 mL/min (by C-G formula based on SCr of 0.62 mg/dL). Liver Function Tests: No results for input(s): AST, ALT, ALKPHOS, BILITOT, PROT, ALBUMIN in the last 168 hours. No results for input(s): LIPASE, AMYLASE in the last 168 hours. No results for input(s): AMMONIA in the last 168 hours. Coagulation Profile: No results for input(s): INR, PROTIME in the last 168 hours. Cardiac Enzymes: No results for input(s): CKTOTAL, CKMB, CKMBINDEX, TROPONINI in the last 168 hours. BNP (last 3 results) No results for input(s): PROBNP in the last 8760 hours. HbA1C: No results for input(s): HGBA1C in the last 72 hours. CBG: No results for input(s): GLUCAP in the last 168 hours. Lipid Profile: No results for input(s): CHOL, HDL, LDLCALC, TRIG, CHOLHDL, LDLDIRECT in the last 72 hours. Thyroid Function Tests: No results for input(s): TSH, T4TOTAL, FREET4, T3FREE, THYROIDAB in the last 72 hours. Anemia Panel: No results for input(s): VITAMINB12, FOLATE, FERRITIN, TIBC, IRON, RETICCTPCT in the last 72 hours. Urine analysis:    Component Value Date/Time   COLORURINE AMBER (A) 05/23/2016 2236   APPEARANCEUR CLEAR 05/23/2016 2236   LABSPEC 1.037 (H) 05/23/2016 2236   PHURINE 6.0 05/23/2016 2236   GLUCOSEU NEGATIVE 05/23/2016 2236   HGBUR NEGATIVE 05/23/2016 2236   BILIRUBINUR NEGATIVE 05/23/2016 2236   KETONESUR 15 (A) 05/23/2016 2236   PROTEINUR NEGATIVE 05/23/2016 2236   NITRITE NEGATIVE 05/23/2016 2236   LEUKOCYTESUR NEGATIVE 05/23/2016 2236   Sepsis Labs: @LABRCNTIP (procalcitonin:4,lacticidven:4) )No results found for this or any previous visit (from the past 240 hour(s)).   Radiological Exams on Admission: Dg Chest 2 View  Result Date: 12/29/2016 CLINICAL DATA:  Acute onset of shortness of breath and generalized chest tightness. Congestion and sore throat. Initial encounter. EXAM: CHEST  2 VIEW COMPARISON:  None.  FINDINGS: The lungs are well-aerated and clear. There is no evidence of focal opacification, pleural effusion or pneumothorax. The heart is normal in size; the mediastinal contour is within normal limits. No acute osseous abnormalities are seen. IMPRESSION: No acute cardiopulmonary process seen. Electronically Signed   By: Garald Balding M.D.   On: 12/29/2016 17:01   Ct Angio Chest Pe W And/or Wo Contrast  Result Date: 12/29/2016 CLINICAL DATA:  Shortness of breath and cough. Right-sided chest pain. EXAM: CT ANGIOGRAPHY CHEST WITH CONTRAST TECHNIQUE: Multidetector CT imaging of the chest was performed using the standard protocol during bolus administration of intravenous contrast. Multiplanar CT image reconstructions and MIPs were obtained to evaluate the vascular anatomy. CONTRAST:  100 cc Isovue 370 COMPARISON:  None. FINDINGS: Cardiovascular: The heart size is normal. No pericardial effusion. No thoracic aortic aneurysm. Lobar pulmonary artery is seen in the lungs bilaterally with segmental and subsegmental pulmonary embolus also noted bilaterally in most prominent in the lower  lobes of each lung. RV/LV ratio is 0.95. Mediastinum/Nodes: No mediastinal lymphadenopathy. There is no hilar lymphadenopathy. The esophagus has normal imaging features. There is no axillary lymphadenopathy. Lungs/Pleura: 4 mm left lower lobe pulmonary nodule seen image 76 series 6. No focal airspace consolidation. No evidence for pulmonary infarct. No pulmonary edema or pleural effusion. Upper Abdomen: 8 mm hypervascular focus in the posterior right liver, likely benign. Musculoskeletal: Bone windows reveal no worrisome lytic or sclerotic osseous lesions. Review of the MIP images confirms the above findings. IMPRESSION: 1. Acute pulmonary embolus involving lobar, segmental and subsegmental pulmonary arteries bilaterally. Positive for acute PE with CT evidence of right heart strain (RV/LV Ratio = 0.95) consistent with at least  submassive (intermediate risk) PE. The presence of right heart strain has been associated with an increased risk of morbidity and mortality. Please activate Code PE by paging [phone]. 2. 4 mm left lower lobe pulmonary nodule. No follow-up needed if patient is low-risk. Non-contrast chest CT can be considered in 12 months if patient is high-risk. This recommendation follows the consensus statement: Guidelines for Management of Incidental Pulmonary Nodules Detected on CT Images: From the Fleischner Society 2017; Radiology 2017; 284:228-243. Critical Value/emergent results were called by telephone at the time of interpretation on 12/29/2016 at 10:09 pm to Dr. Varney Biles , who verbally acknowledged these results. Electronically Signed   By: Misty Stanley M.D.   On: 12/29/2016 22:11    EKG: Independently reviewed.  Assessment/Plan Principal Problem:   Pulmonary embolism ([place])    1. PE with R heart strain - 1. Heparin gtt 2. Consider Lovenox for discharge for at least first week to make sure that menstrual bleeding isnt too heavy while on blood thinners before proceeding to Xarelto given Lovenox' ease of chemical reversal (compared to xarelto) 3. Unless heavy bleeding, I suspect this event likely will end up postponing her elective TAH/BSO procedure.  On the other hand if she has uncontrolled bleeding with menstruation, then will need to see what OB/GYN wants to do. 4. 2d echo 5. BLE venous US   DVT prophylaxis: heparin gtt Code Status: Full Family Communication: Husband at bedside Consults called: None Admission status: Admit to inpatient   Etta Quill DO [facility] Pager [phone] from 7PM-7AM  If 7AM-7PM, please contact the day physician for the patient [email or web address] Password [email or web address]  12/29/2016, 11:58 PM

## 2016-12-29 NOTE — ED Notes (Signed)
MD Gardener at bedside.

## 2016-12-29 NOTE — ED Provider Notes (Signed)
[facility] DEPT Provider Note   CSN: [id number] Arrival date & time: 12/29/16  1537     History   Chief Complaint Chief Complaint  Patient presents with  . Shortness of Breath    HPI Carver Napoleone is a 41 y.o. female.  HPI  Pt comes in with cc of chest pain and DIB. Pt reports that for the past month she has been having intermittent chest pain. For the last week though, her chest pain is getting more frequent, and the last 2 days she has associated dib, so she came to the ER. Pt has hx of PCOS and is on OCP. She has no hx of PE. No dizziness, lightheadedness, no cough/uri like symptoms.   Past Medical History:  Diagnosis Date  . Allergy   . Anxiety   . Polycystic ovarian syndrome   . Uterine fibroid     Patient Active Problem List   Diagnosis Date Noted  . Pulmonary embolism ([place]) 12/29/2016    Past Surgical History:  Procedure Laterality Date  . APPENDECTOMY    . EYE SURGERY      OB History    Gravida Para Term Preterm AB Living   0 0 0 0 0 0   SAB TAB Ectopic Multiple Live Births   0 0 0 0         Home Medications    Prior to Admission medications   Medication Sig Start Date End Date Taking? Authorizing Provider  albuterol (PROVENTIL HFA;VENTOLIN HFA) 108 (90 BASE) MCG/ACT inhaler Inhale 2 puffs into the lungs every 6 (six) hours as needed for wheezing or shortness of breath. 10/28/14  Yes Alveda Reasons, MD  ibuprofen (ADVIL,MOTRIN) 400 MG tablet Take 400 mg by mouth every 6 (six) hours as needed.   Yes Historical Provider, MD  loratadine (CLARITIN) 10 MG tablet Take 10 mg by mouth daily.   Yes Historical Provider, MD  Norgestimate-Ethinyl Estradiol Triphasic (ORTHO TRI-CYCLEN, 28,) 0.18/0.215/0.25 MG-35 MCG tablet Take 1 tablet by mouth daily.   Yes Historical Provider, MD    Family History Family History  Problem Relation Age of Onset  . Hyperlipidemia Brother   . Cancer Maternal Grandmother   . Diabetes Maternal Grandmother   . Hearing  loss Maternal Grandmother   . Heart disease Maternal Grandfather   . Stroke Maternal Grandfather   . Diabetes Paternal Grandmother   . Stroke Paternal Grandfather   . Mental retardation Paternal Grandfather     Social History Social History  Substance Use Topics  . Smoking status: Never Smoker  . Smokeless tobacco: Never Used  . Alcohol use 0.0 oz/week     Comment: occasionally      Allergies   Hydrocodone and Amoxicillin   Review of Systems Review of Systems  ROS 10 Systems reviewed and are negative for acute change except as noted in the HPI.    Physical Exam Updated Vital Signs BP 119/87   Pulse 91   Temp 99.2 F (37.3 C) (Oral)   Resp 23   Ht 5\' 4"  (1.626 m)   Wt 160 lb (72.6 kg)   LMP 12/04/2016 (Exact Date)   SpO2 96%   BMI 27.46 kg/m   Physical Exam   ED Treatments / Results  Labs (all labs ordered are listed, but only abnormal results are displayed) Labs Reviewed  CBC - Abnormal; Notable for the following:       Result Value   WBC 10.7 (*)    Hemoglobin 11.5 (*)  HCT 34.2 (*)    All other components within normal limits  BASIC METABOLIC PANEL  HEPARIN LEVEL (UNFRACTIONATED)  CBC  I-STAT TROPOININ, ED  I-STAT TROPOININ, ED    EKG  EKG Interpretation  Date/Time:  Wednesday December 29 2016 15:46:01 EST Ventricular Rate:  109 PR Interval:  158 QRS Duration: 74 QT Interval:  326 QTC Calculation: 439 R Axis:   92 Text Interpretation:  Sinus tachycardia Possible Lateral infarct , age undetermined Abnormal ECG No acute changes s1q3 noted, no t3 No old tracing to compare Confirmed by Kathrynn Humble, MD, Thelma Comp [phone]) on 12/29/2016 8:06:27 PM       Radiology Dg Chest 2 View  Result Date: 12/29/2016 CLINICAL DATA:  Acute onset of shortness of breath and generalized chest tightness. Congestion and sore throat. Initial encounter. EXAM: CHEST  2 VIEW COMPARISON:  None. FINDINGS: The lungs are well-aerated and clear. There is no evidence of focal  opacification, pleural effusion or pneumothorax. The heart is normal in size; the mediastinal contour is within normal limits. No acute osseous abnormalities are seen. IMPRESSION: No acute cardiopulmonary process seen. Electronically Signed   By: Garald Balding M.D.   On: 12/29/2016 17:01   Ct Angio Chest Pe W And/or Wo Contrast  Result Date: 12/29/2016 CLINICAL DATA:  Shortness of breath and cough. Right-sided chest pain. EXAM: CT ANGIOGRAPHY CHEST WITH CONTRAST TECHNIQUE: Multidetector CT imaging of the chest was performed using the standard protocol during bolus administration of intravenous contrast. Multiplanar CT image reconstructions and MIPs were obtained to evaluate the vascular anatomy. CONTRAST:  100 cc Isovue 370 COMPARISON:  None. FINDINGS: Cardiovascular: The heart size is normal. No pericardial effusion. No thoracic aortic aneurysm. Lobar pulmonary artery is seen in the lungs bilaterally with segmental and subsegmental pulmonary embolus also noted bilaterally in most prominent in the lower lobes of each lung. RV/LV ratio is 0.95. Mediastinum/Nodes: No mediastinal lymphadenopathy. There is no hilar lymphadenopathy. The esophagus has normal imaging features. There is no axillary lymphadenopathy. Lungs/Pleura: 4 mm left lower lobe pulmonary nodule seen image 76 series 6. No focal airspace consolidation. No evidence for pulmonary infarct. No pulmonary edema or pleural effusion. Upper Abdomen: 8 mm hypervascular focus in the posterior right liver, likely benign. Musculoskeletal: Bone windows reveal no worrisome lytic or sclerotic osseous lesions. Review of the MIP images confirms the above findings. IMPRESSION: 1. Acute pulmonary embolus involving lobar, segmental and subsegmental pulmonary arteries bilaterally. Positive for acute PE with CT evidence of right heart strain (RV/LV Ratio = 0.95) consistent with at least submassive (intermediate risk) PE. The presence of right heart strain has been  associated with an increased risk of morbidity and mortality. Please activate Code PE by paging [phone]. 2. 4 mm left lower lobe pulmonary nodule. No follow-up needed if patient is low-risk. Non-contrast chest CT can be considered in 12 months if patient is high-risk. This recommendation follows the consensus statement: Guidelines for Management of Incidental Pulmonary Nodules Detected on CT Images: From the Fleischner Society 2017; Radiology 2017; 284:228-243. Critical Value/emergent results were called by telephone at the time of interpretation on 12/29/2016 at 10:09 pm to Dr. Varney Biles , who verbally acknowledged these results. Electronically Signed   By: Misty Stanley M.D.   On: 12/29/2016 22:11    Procedures Procedures (including critical care time)  CRITICAL CARE Performed by: Varney Biles   Total critical care time: 40 minutes  Critical care time was exclusive of separately billable procedures and treating other patients.  Critical care was  necessary to treat or prevent imminent or life-threatening deterioration.  Critical care was time spent personally by me on the following activities: development of treatment plan with patient and/or surrogate as well as nursing, discussions with consultants, evaluation of patient's response to treatment, examination of patient, obtaining history from patient or surrogate, ordering and performing treatments and interventions, ordering and review of laboratory studies, ordering and review of radiographic studies, pulse oximetry and re-evaluation of patient's condition.   Medications Ordered in ED Medications  heparin ADULT infusion 100 units/mL (25000 units/27mL sodium chloride 0.45%) (1,200 Units/hr Intravenous New Bag/Given 12/29/16 2247)  iopamidol (ISOVUE-370) 76 % injection (100 mLs  Contrast Given 12/29/16 2131)  heparin bolus via infusion 4,000 Units (4,000 Units Intravenous Bolus from Bag 12/29/16 2247)     Initial Impression /  Assessment and Plan / ED Course  I have reviewed the triage vital signs and the nursing notes.  Pertinent labs & imaging results that were available during my care of the patient were reviewed by me and considered in my medical decision making (see chart for details).     Pt comes in with cc of DIB. Pre-test probability for PE extremely high given high estrogen state due to PCOS and OCP. We did a CT PE and out suspicions were confirmed. Results from the ER workup discussed with the patient face to face and all questions answered to the best of my ability. Pt will be admitted for PE.  Final Clinical Impressions(s) / ED Diagnoses   Final diagnoses:  Other acute pulmonary embolism with acute cor pulmonale (HCC)    New Prescriptions New Prescriptions   No medications on file     Varney Biles, MD 12/29/16 2356

## 2016-12-29 NOTE — ED Triage Notes (Signed)
Pt reports onset of sob and chest tightness yesterday. [facility] here for abnormal ekg. Reports recent swelling to ankles, recent pain to right leg/foot. No resp distress noted at triage, ekg done.

## 2016-12-30 ENCOUNTER — Encounter (HOSPITAL_COMMUNITY): Payer: Self-pay | Admitting: General Practice

## 2016-12-30 ENCOUNTER — Inpatient Hospital Stay (HOSPITAL_COMMUNITY): Payer: BLUE CROSS/BLUE SHIELD

## 2016-12-30 DIAGNOSIS — I2699 Other pulmonary embolism without acute cor pulmonale: Secondary | ICD-10-CM

## 2016-12-30 DIAGNOSIS — F411 Generalized anxiety disorder: Secondary | ICD-10-CM

## 2016-12-30 DIAGNOSIS — I824Z1 Acute embolism and thrombosis of unspecified deep veins of right distal lower extremity: Secondary | ICD-10-CM

## 2016-12-30 DIAGNOSIS — D259 Leiomyoma of uterus, unspecified: Secondary | ICD-10-CM

## 2016-12-30 LAB — CBC
HEMATOCRIT: 34.8 % — AB (ref 36.0–46.0)
Hemoglobin: 11.4 g/dL — ABNORMAL LOW (ref 12.0–15.0)
MCH: 27.5 pg (ref 26.0–34.0)
MCHC: 32.8 g/dL (ref 30.0–36.0)
MCV: 83.9 fL (ref 78.0–100.0)
Platelets: 389 10*3/uL (ref 150–400)
RBC: 4.15 MIL/uL (ref 3.87–5.11)
RDW: 14.6 % (ref 11.5–15.5)
WBC: 12.2 10*3/uL — ABNORMAL HIGH (ref 4.0–10.5)

## 2016-12-30 LAB — HEPARIN LEVEL (UNFRACTIONATED)
HEPARIN UNFRACTIONATED: 0.33 [IU]/mL (ref 0.30–0.70)
Heparin Unfractionated: 0.31 IU/mL (ref 0.30–0.70)
Heparin Unfractionated: 0.36 IU/mL (ref 0.30–0.70)

## 2016-12-30 MED ORDER — HEPARIN (PORCINE) IN NACL 100-0.45 UNIT/ML-% IJ SOLN
1800.0000 [IU]/h | INTRAMUSCULAR | Status: DC
  Administered 2016-12-30: 1600 [IU]/h via INTRAVENOUS
  Filled 2016-12-30: qty 250

## 2016-12-30 MED ORDER — ONDANSETRON HCL 4 MG/2ML IJ SOLN: 4.0000 mg | Freq: Four times a day (QID) | INTRAMUSCULAR | Status: DC | PRN

## 2016-12-30 MED ORDER — MORPHINE SULFATE (PF) 4 MG/ML IV SOLN
2.0000 mg | INTRAVENOUS | Status: DC | PRN
Start: 2016-12-30 — End: 2016-12-31
  Administered 2016-12-30 (×3): 2 mg via INTRAVENOUS
  Filled 2016-12-30 (×3): qty 1

## 2016-12-30 NOTE — Progress Notes (Signed)
[place] for heparin Indication: pulmonary embolus  Allergies  Allergen Reactions  . Hydrocodone Nausea And Vomiting  . Amoxicillin Rash    Patient Measurements: Height: 5\' 4"  (162.6 cm) Weight: 160 lb (72.6 kg) IBW/kg (Calculated) : 54.7 Heparin Dosing Weight: 70kg  Vital Signs: Temp: 98.6 F (37 C) (01/25 1455) Temp Source: Oral (01/25 1455) BP: 92/76 (01/25 1455) Pulse Rate: 80 (01/25 1455)  Labs:  Recent Labs  12/29/16 1554 12/30/16 0204 12/30/16 1052 12/30/16 1839  HGB 11.5* 11.4*  --   --   HCT 34.2* 34.8*  --   --   PLT 381 389  --   --   HEPARINUNFRC  --  0.33 0.31 0.36  CREATININE 0.62  --   --   --     Estimated Creatinine Clearance: 92.3 mL/min (by C-G formula based on SCr of 0.62 mg/dL).  . heparin 1,450 Units/hr (12/30/16 1342)     Assessment: 17 yof on heparin for bilateral PE with right heart strain. Heparin level is therapeutic on low end of range. Prior increase had non to minimal effect on heparin level. Cbc stable and wnl. Dopplers also positive for DVT in R leg.   PM heparin level remains in goal range, but at lower end.  No bleeding or complications noted.    Goal of Therapy:  Heparin level 0.3-0.7 units/ml (aim for 0.5-0.7 with b/l PE) Monitor platelets by anticoagulation protocol: Yes    Plan:  Increase heparin gtt to 1600 units/hr.  Would like to keep a little higher in range given clot burden. Check a 6 hr heparin level F/u plans for long-term anticoagulation.  Uvaldo Rising, BCPS  Clinical Pharmacist Pager [phone]  12/30/2016 7:22 PM

## 2016-12-30 NOTE — ED Notes (Addendum)
Heparin Rate changed to 13.70ml/hr per pharmacist.

## 2016-12-30 NOTE — ED Notes (Signed)
Pt transferred to inpatient bed instead of ED stretcher for comfort.  Pt tolerated well.

## 2016-12-30 NOTE — Progress Notes (Signed)
VASCULAR LAB PRELIMINARY  PRELIMINARY  PRELIMINARY  PRELIMINARY  Bilateral lower extremity venous duplex completed.    Preliminary report:  There is acute, non occlusive DVT noted in a small portion of the right peroneal vein.   Jaquelin Meaney, RVT 12/30/2016, 10:27 AM

## 2016-12-30 NOTE — Progress Notes (Signed)
PROGRESS NOTE  Crystal Spencer [id number] DOB: [birth year] DOA: 12/29/2016 PCP: No PCP Per Patient  HPI/Recap of past 24 hours:  less pleuritic chest pain, no room air Right leg pain is better  Assessment/Plan: Principal Problem:   Pulmonary embolism (HCC)  Bilateral PE and acute DVT right lower extremity (acute, non occlusive DVT noted in a small portion of the right peroneal vein) Patient has been on OCP which is discontinued She has right heart strain, she is continued on heparin drip for now, patient prefer to be discharged on lovenox first , then discuss with her primary care physician about transition to oral anticoagulation I have advised her no flying for the next two weeks ( she report a strip to [place] in a week)  Uterine fibroid:  D/c ocp Outpatient follow up with gyn   anxiety: need constant reassurance , she has a lot of questions, and I spent prolonged time in patient counseling.    Code Status: full  Family Communication: patient   Disposition Plan: home , likely 1-2 days   Consultants:  none  Procedures:  none  Antibiotics:  none   Objective: BP 113/73   Pulse 78   Temp 99.2 F (37.3 C) (Oral)   Resp 18   Ht 5\' 4"  (1.626 m)   Wt 72.6 kg (160 lb)   LMP 12/04/2016 (Exact Date)   SpO2 99%   BMI 27.46 kg/m  No intake or output data in the 24 hours ending 12/30/16 0752 Filed Weights   12/29/16 1919  Weight: 72.6 kg (160 lb)    Exam:   General:  NAD, anxious  Cardiovascular: RRR  Respiratory: CTABL  Abdomen: Soft/ND/NT, positive BS  Musculoskeletal: No Edema  Neuro: aaox3  Data Reviewed: Basic Metabolic Panel:  Recent Labs Lab 12/29/16 1554  NA 138  K 3.9  CL 108  CO2 23  GLUCOSE 95  BUN 6  CREATININE 0.62  CALCIUM 9.2   Liver Function Tests: No results for input(s): AST, ALT, ALKPHOS, BILITOT, PROT, ALBUMIN in the last 168 hours. No results for input(s): LIPASE, AMYLASE in the last 168 hours. No results  for input(s): AMMONIA in the last 168 hours. CBC:  Recent Labs Lab 12/29/16 1554 12/30/16 0204  WBC 10.7* 12.2*  HGB 11.5* 11.4*  HCT 34.2* 34.8*  MCV 83.2 83.9  PLT 381 389   Cardiac Enzymes:   No results for input(s): CKTOTAL, CKMB, CKMBINDEX, TROPONINI in the last 168 hours. BNP (last 3 results) No results for input(s): BNP in the last 8760 hours.  ProBNP (last 3 results) No results for input(s): PROBNP in the last 8760 hours.  CBG: No results for input(s): GLUCAP in the last 168 hours.  No results found for this or any previous visit (from the past 240 hour(s)).   Studies: Dg Chest 2 View  Result Date: 12/29/2016 CLINICAL DATA:  Acute onset of shortness of breath and generalized chest tightness. Congestion and sore throat. Initial encounter. EXAM: CHEST  2 VIEW COMPARISON:  None. FINDINGS: The lungs are well-aerated and clear. There is no evidence of focal opacification, pleural effusion or pneumothorax. The heart is normal in size; the mediastinal contour is within normal limits. No acute osseous abnormalities are seen. IMPRESSION: No acute cardiopulmonary process seen. Electronically Signed   By: Garald Balding M.D.   On: 12/29/2016 17:01   Ct Angio Chest Pe W And/or Wo Contrast  Result Date: 12/29/2016 CLINICAL DATA:  Shortness of breath and cough. Right-sided chest pain. EXAM:  CT ANGIOGRAPHY CHEST WITH CONTRAST TECHNIQUE: Multidetector CT imaging of the chest was performed using the standard protocol during bolus administration of intravenous contrast. Multiplanar CT image reconstructions and MIPs were obtained to evaluate the vascular anatomy. CONTRAST:  100 cc Isovue 370 COMPARISON:  None. FINDINGS: Cardiovascular: The heart size is normal. No pericardial effusion. No thoracic aortic aneurysm. Lobar pulmonary artery is seen in the lungs bilaterally with segmental and subsegmental pulmonary embolus also noted bilaterally in most prominent in the lower lobes of each lung.  RV/LV ratio is 0.95. Mediastinum/Nodes: No mediastinal lymphadenopathy. There is no hilar lymphadenopathy. The esophagus has normal imaging features. There is no axillary lymphadenopathy. Lungs/Pleura: 4 mm left lower lobe pulmonary nodule seen image 76 series 6. No focal airspace consolidation. No evidence for pulmonary infarct. No pulmonary edema or pleural effusion. Upper Abdomen: 8 mm hypervascular focus in the posterior right liver, likely benign. Musculoskeletal: Bone windows reveal no worrisome lytic or sclerotic osseous lesions. Review of the MIP images confirms the above findings. IMPRESSION: 1. Acute pulmonary embolus involving lobar, segmental and subsegmental pulmonary arteries bilaterally. Positive for acute PE with CT evidence of right heart strain (RV/LV Ratio = 0.95) consistent with at least submassive (intermediate risk) PE. The presence of right heart strain has been associated with an increased risk of morbidity and mortality. Please activate Code PE by paging [phone]. 2. 4 mm left lower lobe pulmonary nodule. No follow-up needed if patient is low-risk. Non-contrast chest CT can be considered in 12 months if patient is high-risk. This recommendation follows the consensus statement: Guidelines for Management of Incidental Pulmonary Nodules Detected on CT Images: From the Fleischner Society 2017; Radiology 2017; 284:228-243. Critical Value/emergent results were called by telephone at the time of interpretation on 12/29/2016 at 10:09 pm to Dr. Varney Biles , who verbally acknowledged these results. Electronically Signed   By: Misty Stanley M.D.   On: 12/29/2016 22:11    Scheduled Meds:  Continuous Infusions: . heparin 1,350 Units/hr (12/30/16 [id number])     Time spent: 64mins  Pama Roskos MD, PhD  [facility] Pager [phone]. If 7PM-7AM, please contact night-coverage at [email or web address], password [facility] 12/30/2016, 7:52 AM  LOS: 1 day

## 2016-12-30 NOTE — ED Notes (Signed)
Pt ambulatory to restroom

## 2016-12-30 NOTE — ED Notes (Signed)
Called to pt's room, pt reports RLE pain that just began, describes "like a bruise".  Pt also reports episode of chest pain while talking to her mother on the phone, lasted approximately 10-15 seconds.  RLE has good pedal pulse, skin is warm and dry; cap refill < 3 seconds.

## 2016-12-30 NOTE — ED Notes (Signed)
Pt c/o increasing headache.

## 2016-12-30 NOTE — Progress Notes (Addendum)
[place] for heparin Indication: pulmonary embolus  Allergies  Allergen Reactions  . Hydrocodone Nausea And Vomiting  . Amoxicillin Rash    Patient Measurements: Height: 5\' 4"  (162.6 cm) Weight: 160 lb (72.6 kg) IBW/kg (Calculated) : 54.7 Heparin Dosing Weight: 70kg  Vital Signs: BP: 103/64 (01/25 1139) Pulse Rate: 75 (01/25 1139)  Labs:  Recent Labs  12/29/16 1554 12/30/16 0204 12/30/16 1052  HGB 11.5* 11.4*  --   HCT 34.2* 34.8*  --   PLT 381 389  --   HEPARINUNFRC  --  0.33 0.31  CREATININE 0.62  --   --     Estimated Creatinine Clearance: 92.3 mL/min (by C-G formula based on SCr of 0.62 mg/dL).  Assessment: 57 yof on heparin for bilateral PE with right heart strain. Heparin level is therapeutic on low end of range. Prior increase had non to minimal effect on heparin level. Cbc stable and wnl. Dopplers also positive for DVT in R leg.    Goal of Therapy:  Heparin level 0.3-0.7 units/ml (aim for 0.5-0.7 with b/l PE) Monitor platelets by anticoagulation protocol: Yes    Plan:  Increase heparin gtt to 1450 units/hr Check a 6 hr heparin level F/u plans for long-term anticoagulation.    Hughes Better, PharmD, BCPS Clinical Pharmacist 12/30/2016 12:05 PM

## 2016-12-30 NOTE — Progress Notes (Signed)
ANTICOAGULATION CONSULT NOTE - Follow-up Consult  Pharmacy Consult for heparin Indication: pulmonary embolus  Allergies  Allergen Reactions  . Hydrocodone Nausea And Vomiting  . Amoxicillin Rash    Patient Measurements: Height: 5\' 4"  (162.6 cm) Weight: 160 lb (72.6 kg) IBW/kg (Calculated) : 54.7 Heparin Dosing Weight: 70kg  Vital Signs: BP: 104/73 (01/25 0300) Pulse Rate: 94 (01/25 0300)  Labs:  Recent Labs  12/29/16 1554 12/30/16 0204  HGB 11.5* 11.4*  HCT 34.2* 34.8*  PLT 381 389  HEPARINUNFRC  --  0.33  CREATININE 0.62  --     Estimated Creatinine Clearance: 92.3 mL/min (by C-G formula based on SCr of 0.62 mg/dL).  Assessment: 72 yof on heparin for bilateral PE with right heart strain. Heparin level (0.33) - low end of therapeutic and drawn ~4 hours post gtt start so likely still being influenced by bolus which would artifically elevate. No bleeding noted.  Goal of Therapy:  Heparin level 0.3-0.7 units/ml (aim for 0.5-0.7 with b/l PE) Monitor platelets by anticoagulation protocol: Yes   Plan:  Increase heparin gtt to 1350 units/hr Check a 6 hr heparin level F/u plans for long-term anticoagulation.  Sherlon Handing, PharmD, BCPS Clinical pharmacist, pager [phone] 12/30/2016,3:51 AM

## 2016-12-31 ENCOUNTER — Telehealth: Payer: Self-pay | Admitting: Obstetrics & Gynecology

## 2016-12-31 ENCOUNTER — Inpatient Hospital Stay (HOSPITAL_COMMUNITY): Payer: BLUE CROSS/BLUE SHIELD

## 2016-12-31 DIAGNOSIS — I82401 Acute embolism and thrombosis of unspecified deep veins of right lower extremity: Secondary | ICD-10-CM

## 2016-12-31 DIAGNOSIS — R9431 Abnormal electrocardiogram [ECG] [EKG]: Secondary | ICD-10-CM

## 2016-12-31 LAB — CBC
HCT: 34.5 % — ABNORMAL LOW (ref 36.0–46.0)
Hemoglobin: 11.2 g/dL — ABNORMAL LOW (ref 12.0–15.0)
MCH: 27.5 pg (ref 26.0–34.0)
MCHC: 32.5 g/dL (ref 30.0–36.0)
MCV: 84.6 fL (ref 78.0–100.0)
PLATELETS: 367 10*3/uL (ref 150–400)
RBC: 4.08 MIL/uL (ref 3.87–5.11)
RDW: 14.4 % (ref 11.5–15.5)
WBC: 7.8 10*3/uL (ref 4.0–10.5)

## 2016-12-31 LAB — BASIC METABOLIC PANEL
Anion gap: 8 (ref 5–15)
BUN: 6 mg/dL (ref 6–20)
CO2: 22 mmol/L (ref 22–32)
Calcium: 9 mg/dL (ref 8.9–10.3)
Chloride: 105 mmol/L (ref 101–111)
Creatinine, Ser: 0.64 mg/dL (ref 0.44–1.00)
GFR calc Af Amer: >60 mL/min (ref >60)
GLUCOSE: 96 mg/dL (ref 65–99)
POTASSIUM: 4.4 mmol/L (ref 3.5–5.1)
Sodium: 135 mmol/L (ref 135–145)

## 2016-12-31 LAB — ECHOCARDIOGRAM COMPLETE
HEIGHTINCHES: 64 in
Weight: 2560 oz

## 2016-12-31 LAB — MAGNESIUM: Magnesium: 2.2 mg/dL (ref 1.7–2.4)

## 2016-12-31 LAB — HEPARIN LEVEL (UNFRACTIONATED)
HEPARIN UNFRACTIONATED: 0.34 [IU]/mL (ref 0.30–0.70)
HEPARIN UNFRACTIONATED: 0.75 [IU]/mL — AB (ref 0.30–0.70)

## 2016-12-31 MED ORDER — ENOXAPARIN (LOVENOX) PATIENT EDUCATION KIT
PACK | Freq: Once | Status: DC
  Filled 2016-12-31: qty 1

## 2016-12-31 MED ORDER — FLUTICASONE PROPIONATE 50 MCG/ACT NA SUSP
2.0000 | Freq: Every day | NASAL | 0 refills | Status: AC
Start: 2016-12-31 — End: ?

## 2016-12-31 MED ORDER — GUAIFENESIN ER 600 MG PO TB12
600.0000 mg | ORAL_TABLET | Freq: Two times a day (BID) | ORAL | Status: DC
  Administered 2016-12-31: 600 mg via ORAL
  Filled 2016-12-31: qty 1

## 2016-12-31 MED ORDER — ENOXAPARIN SODIUM 80 MG/0.8ML ~~LOC~~ SOLN: 70.0000 mg | Freq: Two times a day (BID) | SUBCUTANEOUS | 0 refills | Status: DC

## 2016-12-31 MED ORDER — ENOXAPARIN SODIUM 80 MG/0.8ML ~~LOC~~ SOLN
70.0000 mg | Freq: Two times a day (BID) | SUBCUTANEOUS | Status: DC
  Administered 2016-12-31: 70 mg via SUBCUTANEOUS
  Filled 2016-12-31 (×2): qty 0.8

## 2016-12-31 MED ORDER — ACETAMINOPHEN 325 MG PO TABS
650.0000 mg | ORAL_TABLET | Freq: Four times a day (QID) | ORAL | Status: DC | PRN
  Administered 2016-12-31: 650 mg via ORAL
  Filled 2016-12-31: qty 2

## 2016-12-31 MED ORDER — GUAIFENESIN ER 600 MG PO TB12: 600.0000 mg | ORAL_TABLET | Freq: Two times a day (BID) | ORAL | 0 refills | Status: DC

## 2016-12-31 MED ORDER — FLUTICASONE PROPIONATE 50 MCG/ACT NA SUSP
2.0000 | Freq: Every day | NASAL | Status: DC
  Administered 2016-12-31: 2 via NASAL
  Filled 2016-12-31: qty 16

## 2016-12-31 NOTE — Telephone Encounter (Signed)
As I was placing pts preop order I noticed that she was in the hosp currently. I placed a call to her. She reports that she is being treated for a PE with a DVT. Her OCPs that were started approximately 47months prev at the HD have been stopped.  She is feeling better.  I have informed her that her surgery will need to be delayed. I will se her in the ofc in 3 weeks to see what her bleeding is like off the OCPs. Will delay her surgery by 4 additional weeks and she will need pulmonary clearance prior to the actual procedure.  Pt expressed understanding of the delay in surgery and expressed appreciation for the call. All questions were answered.  Dalinda Heidt L. Harraway-Smith, M.D., Cherlynn June

## 2016-12-31 NOTE — Discharge Summary (Signed)
Discharge Summary  Crystal Spencer [id number] DOB: 03-May-1976  PCP: No PCP Per Patient  Admit date: 12/29/2016 Discharge date: 12/31/2016  Time spent: >82mins, more than 50% time spent of coordination of care and patient's counseling   Recommendations for Outpatient Follow-up:  1. F/u with PMD within a week  for hospital discharge follow up, repeat cbc/bmp at follow up, patient prefer to discuss choice of long term  anticoagulation with pmd 2. F/u with gyn for uterine fiborid  Discharge Diagnoses:  Active Hospital Problems   Diagnosis Date Noted  . Pulmonary embolism ([place]) 12/29/2016    Resolved Hospital Problems   Diagnosis Date Noted Date Resolved  No resolved problems to display.    Discharge Condition: stable  Diet recommendation: regular diet  Filed Weights   12/29/16 1919  Weight: 72.6 kg (160 lb)    History of present illness:  HPI: Crystal Spencer is a 41 y.o. female with medical history significant of PCOS, uterine fibroid.  Patient on OCP.  Patient presents to the ED with 4 day history of SOB and chest tightness.  Symptoms worsening.  No recent travel but has had RLE pain recently.  Nothing tried for symptoms, nothing makes better or worse.  Went to UC for symptoms and got sent to ED for abnormal EKG.  ED Course: EKG shows S1, Q3, QAVF, but upright T waves in lead 3.  Still with 2x negative troponins this was enough for EDP to perform CTA PE study which was positive for PE with R heart strain.  Hospital Course:  Principal Problem:   Pulmonary embolism (HCC)  Bilateral PE and acute DVT right lower extremity (acute, non occlusive DVT noted in a small portion of the right peroneal vein) Evidenced by CTA chest and venous doppler right lower extremity Patient has been on OCP which is discontinued she is started on heparin drip since admission, pleuritic chest pain and right leg pain has resolved, she remains on room air, able to ambulate without  difficulty patient prefers to be discharged on lovenox first , then discuss with her primary care physician about transition to oral anticoagulation. She will need at least 90months of anticoagulation. She should not skin any dose of anticoagulation without discuss with pmd, she expressed understanding. Echocardiogram unremarkable. I have advised her no flying for the next two weeks ( she report a strip to [place] in a week)  Nasal congestion, sinusitis: started mucinex and flonase  Uterine fibroid/PCOS:  D/c ocp Outpatient follow up with gyn   anxiety: need constant reassurance , she has a lot of questions, and I spent prolonged time in patient counseling.    Code Status: full  Family Communication: patient   Disposition Plan: home on 1/26   Consultants:  none  Procedures:  none  Antibiotics:  none   Discharge Exam: BP 107/60 (BP Location: Left Arm)   Pulse 63   Temp 98.5 F (36.9 C) (Oral)   Resp 16   Ht 5\' 4"  (1.626 m)   Wt 72.6 kg (160 lb)   LMP 12/04/2016 (Exact Date)   SpO2 99%   BMI 27.46 kg/m   General: NAD Cardiovascular: RRR Respiratory: CATBL  Discharge Instructions You were cared for by a hospitalist during your hospital stay. If you have any questions about your discharge medications or the care you received while you were in the hospital after you are discharged, you can call the unit and asked to speak with the hospitalist on call if the hospitalist that took care  of you is not available. Once you are discharged, your primary care physician will handle any further medical issues. Please note that NO REFILLS for any discharge medications will be authorized once you are discharged, as it is imperative that you return to your primary care physician (or establish a relationship with a primary care physician if you do not have one) for your aftercare needs so that they can reassess your need for medications and monitor your lab  values.  Discharge Instructions    Diet general    Complete by:  As directed    Increase activity slowly    Complete by:  As directed      Allergies as of 12/31/2016      Reactions   Hydrocodone Nausea And Vomiting   Amoxicillin Rash      Medication List    STOP taking these medications   ORTHO TRI-CYCLEN (28) 0.18/0.215/0.25 MG-35 MCG tablet Generic drug:  Norgestimate-Ethinyl Estradiol Triphasic     TAKE these medications   albuterol 108 (90 Base) MCG/ACT inhaler Commonly known as:  PROVENTIL HFA;VENTOLIN HFA Inhale 2 puffs into the lungs every 6 (six) hours as needed for wheezing or shortness of breath.   enoxaparin 80 MG/0.8ML injection Commonly known as:  LOVENOX Inject 0.7 mLs (70 mg total) into the skin every 12 (twelve) hours.   fluticasone 50 MCG/ACT nasal spray Commonly known as:  FLONASE Place 2 sprays into both nostrils daily.   guaiFENesin 600 MG 12 hr tablet Commonly known as:  MUCINEX Take 1 tablet (600 mg total) by mouth 2 (two) times daily.   ibuprofen 400 MG tablet Commonly known as:  ADVIL,MOTRIN Take 400 mg by mouth every 6 (six) hours as needed.   loratadine 10 MG tablet Commonly known as:  CLARITIN Take 10 mg by mouth daily.      Allergies  Allergen Reactions  . Hydrocodone Nausea And Vomiting  . Amoxicillin Rash   Follow-up Information    follow up with pmd in one week Follow up.   Why:  to discuss long term blood thinner choice repeat cbc/bmp at follow up       follow up with gyn for uterine fibroids Follow up.            The results of significant diagnostics from this hospitalization (including imaging, microbiology, ancillary and laboratory) are listed below for reference.    Significant Diagnostic Studies: Dg Chest 2 View  Result Date: 12/29/2016 CLINICAL DATA:  Acute onset of shortness of breath and generalized chest tightness. Congestion and sore throat. Initial encounter. EXAM: CHEST  2 VIEW COMPARISON:  None.  FINDINGS: The lungs are well-aerated and clear. There is no evidence of focal opacification, pleural effusion or pneumothorax. The heart is normal in size; the mediastinal contour is within normal limits. No acute osseous abnormalities are seen. IMPRESSION: No acute cardiopulmonary process seen. Electronically Signed   By: Garald Balding M.D.   On: 12/29/2016 17:01   Ct Angio Chest Pe W And/or Wo Contrast  Result Date: 12/29/2016 CLINICAL DATA:  Shortness of breath and cough. Right-sided chest pain. EXAM: CT ANGIOGRAPHY CHEST WITH CONTRAST TECHNIQUE: Multidetector CT imaging of the chest was performed using the standard protocol during bolus administration of intravenous contrast. Multiplanar CT image reconstructions and MIPs were obtained to evaluate the vascular anatomy. CONTRAST:  100 cc Isovue 370 COMPARISON:  None. FINDINGS: Cardiovascular: The heart size is normal. No pericardial effusion. No thoracic aortic aneurysm. Lobar pulmonary artery is seen in the  lungs bilaterally with segmental and subsegmental pulmonary embolus also noted bilaterally in most prominent in the lower lobes of each lung. RV/LV ratio is 0.95. Mediastinum/Nodes: No mediastinal lymphadenopathy. There is no hilar lymphadenopathy. The esophagus has normal imaging features. There is no axillary lymphadenopathy. Lungs/Pleura: 4 mm left lower lobe pulmonary nodule seen image 76 series 6. No focal airspace consolidation. No evidence for pulmonary infarct. No pulmonary edema or pleural effusion. Upper Abdomen: 8 mm hypervascular focus in the posterior right liver, likely benign. Musculoskeletal: Bone windows reveal no worrisome lytic or sclerotic osseous lesions. Review of the MIP images confirms the above findings. IMPRESSION: 1. Acute pulmonary embolus involving lobar, segmental and subsegmental pulmonary arteries bilaterally. Positive for acute PE with CT evidence of right heart strain (RV/LV Ratio = 0.95) consistent with at least  submassive (intermediate risk) PE. The presence of right heart strain has been associated with an increased risk of morbidity and mortality. Please activate Code PE by paging [phone]. 2. 4 mm left lower lobe pulmonary nodule. No follow-up needed if patient is low-risk. Non-contrast chest CT can be considered in 12 months if patient is high-risk. This recommendation follows the consensus statement: Guidelines for Management of Incidental Pulmonary Nodules Detected on CT Images: From the Fleischner Society 2017; Radiology 2017; 284:228-243. Critical Value/emergent results were called by telephone at the time of interpretation on 12/29/2016 at 10:09 pm to Dr. Varney Biles , who verbally acknowledged these results. Electronically Signed   By: Misty Stanley M.D.   On: 12/29/2016 22:11    Microbiology: No results found for this or any previous visit (from the past 240 hour(s)).   Labs: Basic Metabolic Panel:  Recent Labs Lab 12/29/16 1554 12/31/16 0110  NA 138 135  K 3.9 4.4  CL 108 105  CO2 23 22  GLUCOSE 95 96  BUN 6 6  CREATININE 0.62 0.64  CALCIUM 9.2 9.0  MG  --  2.2   Liver Function Tests: No results for input(s): AST, ALT, ALKPHOS, BILITOT, PROT, ALBUMIN in the last 168 hours. No results for input(s): LIPASE, AMYLASE in the last 168 hours. No results for input(s): AMMONIA in the last 168 hours. CBC:  Recent Labs Lab 12/29/16 1554 12/30/16 0204 12/31/16 0110  WBC 10.7* 12.2* 7.8  HGB 11.5* 11.4* 11.2*  HCT 34.2* 34.8* 34.5*  MCV 83.2 83.9 84.6  PLT 381 389 367   Cardiac Enzymes: No results for input(s): CKTOTAL, CKMB, CKMBINDEX, TROPONINI in the last 168 hours. BNP: BNP (last 3 results) No results for input(s): BNP in the last 8760 hours.  ProBNP (last 3 results) No results for input(s): PROBNP in the last 8760 hours.  CBG: No results for input(s): GLUCAP in the last 168 hours.     SignedFlorencia Reasons MD, PhD  [facility] 12/31/2016, 2:57 PM

## 2016-12-31 NOTE — Progress Notes (Signed)
Insurance check completed for Lovenox [place] @ [employer] [employer] # [phone]    LOVENOX  80 MG BID SYRINGES 2 WK -28 SYRINGES   COVER- YES  CO-PAY- $ 1544.30    ( NO DEDUCTIBLE )  TIER- 4 DRUG  PRIOR APPROVAL- NO   ENOXAPARIN 80 MG  BID SYRINGES 2 WKS /28 SYRINGES   COVER- YES  CO-PAY- $ 10.00  TIER- 1 DRUG  PRIOR APPROVAL- NO   PHARMACY : [facility] , [facility], [facility]             [facility]

## 2016-12-31 NOTE — Progress Notes (Signed)
  Echocardiogram 2D Echocardiogram has been performed.  Crystal Spencer 12/31/2016, 12:27 PM

## 2016-12-31 NOTE — Progress Notes (Signed)
ANTICOAGULATION CONSULT NOTE - Follow Up Consult  Pharmacy Consult for heparin Indication: pulmonary embolus  Labs:  Recent Labs  12/29/16 1554  12/30/16 0204 12/30/16 1052 12/30/16 1839 12/31/16 0110  HGB 11.5*  --  11.4*  --   --  11.2*  HCT 34.2*  --  34.8*  --   --  34.5*  PLT 381  --  389  --   --  367  HEPARINUNFRC  --   < > 0.33 0.31 0.36 0.34  CREATININE 0.62  --   --   --   --   --   < > = values in this interval not displayed.   Assessment: 41yo female remains at goal on heparin though level has dropped some despite rate increase last pm, would prefer higher level w/ PE.  Goal of Therapy:  Heparin level 0.3-0.7 units/ml   Plan:  Will increase heparin gtt to 1800 units/hr and check level in 6hr.  Wynona Neat, PharmD, BCPS  12/31/2016,1:51 AM

## 2016-12-31 NOTE — Progress Notes (Signed)
ANTICOAGULATION CONSULT NOTE - Initial Consult  Pharmacy Consult for Lovenox Indication: DVT and PE  Allergies  Allergen Reactions  . Hydrocodone Nausea And Vomiting  . Amoxicillin Rash    Patient Measurements: Height: 5\' 4"  (162.6 cm) Weight: 160 lb (72.6 kg) IBW/kg (Calculated) : 54.7  Vital Signs: Temp: 98.5 F (36.9 C) (01/26 0603) Temp Source: Oral (01/26 0603) BP: 107/60 (01/26 0603) Pulse Rate: 63 (01/26 0603)  Labs:  Recent Labs  12/29/16 1554 12/30/16 0204  12/30/16 1839 12/31/16 0110 12/31/16 0819  HGB 11.5* 11.4*  --   --  11.2*  --   HCT 34.2* 34.8*  --   --  34.5*  --   PLT 381 389  --   --  367  --   HEPARINUNFRC  --  0.33  < > 0.36 0.34 0.75*  CREATININE 0.62  --   --   --  0.64  --   < > = values in this interval not displayed.  Estimated Creatinine Clearance: 92.3 mL/min (by C-G formula based on SCr of 0.64 mg/dL).   Medical History: Past Medical History:  Diagnosis Date  . Allergy   . Anxiety   . PE (pulmonary thromboembolism) ([place]) 12/29/2016  . Polycystic ovarian syndrome   . Uterine fibroid     Assessment:  Anticoag: PE with R heart strain and DVT (on oral contraception for menorrhagia), no AC PTA. Spoke with Dr. Erlinda Hong. Pt would like to changed from IV Heparin> LMWH x 2 wks then decide on anticoagulation. Patient is scared oral anticoagulation will make her bleed more. CBC stable.  Goal of Therapy:  Anti-Xa level 0.6-1 units/ml 4hrs after LMWH dose given Monitor platelets by anticoagulation protocol: Yes   Plan:  - D/c IV heparin - Lovenox 70mg  BID x 2 wks, then pt to decide on further anticoagulation method. - Stop oral anticoagulation permanently  Raina Sole S. Alford Highland, PharmD, BCPS Clinical Staff Pharmacist Pager [phone]  Eilene Ghazi Stillinger 12/31/2016,9:23 AM

## 2016-12-31 NOTE — Discharge Instructions (Signed)
Pulmonary Embolism °A pulmonary embolism (PE) is a sudden blockage or decrease of blood flow in one lung or both lungs. Most blockages come from a blood clot that travels from the legs or the pelvis to the lungs. PE is a dangerous and potentially life-threatening condition if it is not treated right away. °What are the causes? °A pulmonary embolism occurs most commonly when a blood clot travels from one of your veins to your lungs. Rarely, PE is caused by air, fat, amniotic fluid, or part of a tumor traveling through your veins to your lungs. °What increases the risk? °A PE is more likely to develop in: °· People who smoke. °· People who are older, especially over 60 years of age. °· People who are overweight (obese). °· People who sit or lie still for a long time, such as during long-distance travel (over 4 hours), bed rest, hospitalization, or during recovery from certain medical conditions like a stroke. °· People who do not engage in much physical activity (sedentary lifestyle). °· People who have chronic breathing disorders. °· People who have a personal or family history of blood clots or blood clotting disease. °· People who have peripheral vascular disease (PVD), diabetes, or some types of cancer. °· People who have heart disease, especially if the person had a recent heart attack or has congestive heart failure. °· People who have neurological diseases that affect the legs (leg paresis). °· People who have had a traumatic injury, such as breaking a hip or leg. °· People who have recently had major or lengthy surgery, especially on the hip, knee, or abdomen. °· People who have had a central line placed inside a large vein. °· People who take medicines that contain the hormone estrogen. These include birth control pills and hormone replacement therapy. °· Pregnancy or during childbirth or the postpartum period. ° °What are the signs or symptoms? °The symptoms of a PE usually start suddenly and  include: °· Shortness of breath while active or at rest. °· Coughing or coughing up blood or blood-tinged mucus. °· Chest pain that is often worse with deep breaths. °· Rapid or irregular heartbeat. °· Feeling light-headed or dizzy. °· Fainting. °· Feeling anxious. °· Sweating. ° °There may also be pain and swelling in a leg if that is where the blood clot started. °These symptoms may represent a serious problem that is an emergency. Do not wait to see if the symptoms will go away. Get medical help right away. Call your local emergency services (911 in the [place] Do not drive yourself to the hospital. °How is this diagnosed? °Your health care provider will take a medical history and perform a physical exam. You may also have other tests, including: °· Blood tests to assess the clotting properties of your blood, assess oxygen levels in your blood, and find blood clots. °· Imaging tests, such as CT, ultrasound, MRI, X-ray, and other tests to see if you have clots anywhere in your body. °· An electrocardiogram (ECG) to look for heart strain from blood clots in the lungs. ° °How is this treated? °The main goals of PE treatment are: °· To stop a blood clot from growing larger. °· To stop new blood clots from forming. ° °The type of treatment that you receive depends on many factors, such as the cause of your PE, your risk for bleeding or developing more clots, and other medical conditions that you have. Sometimes, a combination of treatments is necessary. °This condition may be treated with: °· Medicines, including newer oral blood thinners (  anticoagulants), warfarin, low molecular weight heparins, thrombolytics, or heparins. °· Wearing compression stockings or using different types of devices. °· Surgery (rare) to remove the blood clot or to place a filter in your abdomen to stop the blood clot from traveling to your lungs. ° °Treatments for a PE are often divided into immediate treatment, long-term treatment (up to 3  months after PE), and extended treatment (more than 3 months after PE). Your treatment may continue for several months. This is called maintenance therapy, and it is used to prevent the forming of new blood clots. You can work with your health care provider to choose the treatment program that is best for you. °What are anticoagulants? °Anticoagulants are medicines that treat PEs. They can stop current blood clots from growing and stop new clots from forming. They cannot dissolve existing clots. Your body dissolves clots by itself over time. Anticoagulants are given by mouth, by injection, or through an IV tube. °What are thrombolytics? °Thrombolytics are clot-dissolving medicines that are used to dissolve a PE. They carry a high risk of bleeding, so they tend to be used only in severe cases or if you have very low blood pressure. °Follow these instructions at home: °If you are taking a newer oral anticoagulant: °· Take the medicine every single day at the same time each day. °· Understand what foods and drugs interact with this medicine. °· Understand that there are no regular blood tests required when using this medicine. °· Understand the side effects of this medicine, including excessive bruising or bleeding. Ask your health care provider or pharmacist about other possible side effects. °If you are taking warfarin: °· Understand how to take warfarin and know which foods can affect how warfarin works in your body. °· Understand that it is dangerous to take too much or too little warfarin. Too much warfarin increases the risk of bleeding. Too little warfarin continues to allow the risk for blood clots. °· Follow your PT and INR blood testing schedule. The PT and INR results allow your health care provider to adjust your dose of warfarin. It is very important that you have your PT and INR tested as often as told by your health care provider. °· Avoid major changes in your diet, or tell your health care provider  before you change your diet. Arrange a visit with a registered dietitian to answer your questions. Many foods, especially foods that are high in vitamin K, can interfere with warfarin and affect the PT and INR results. Eat a consistent amount of foods that are high in vitamin K, such as: °? Spinach, kale, broccoli, cabbage, collard greens, turnip greens, Brussels sprouts, peas, cauliflower, seaweed, and parsley. °? Beef liver and pork liver. °? Green tea. °? Soybean oil. °· Tell your health care provider about any and all medicines, vitamins, and supplements that you take, including aspirin and other over-the-counter anti-inflammatory medicines. Be especially cautious with aspirin and anti-inflammatory medicines. Do not take those before you ask your health care provider if it is safe to do so. This is important because many medicines can interfere with warfarin and affect the PT and INR results. °· Do not start or stop taking any over-the-counter or prescription medicine unless your health care provider or pharmacist tells you to do so. °If you take warfarin, you will also need to do these things: °· Hold pressure over cuts for longer than usual. °· Tell your dentist and other health care providers that you are taking warfarin before you have   any procedures in which bleeding may occur. °· Avoid alcohol or drink very small amounts. Tell your health care provider if you change your alcohol intake. °· Do not use tobacco products, including cigarettes, chewing tobacco, and e-cigarettes. If you need help quitting, ask your health care provider. °· Avoid contact sports. ° °General instructions °· Take over-the-counter and prescription medicines only as told by your health care provider. Anticoagulant medicines can have side effects, including easy bruising and difficulty stopping bleeding. If you are prescribed an anticoagulant, you will also need to do these things: °? Hold pressure over cuts for longer than  usual. °? Tell your dentist and other health care providers that you are taking anticoagulants before you have any procedures in which bleeding may occur. °? Avoid contact sports. °· Wear a medical alert bracelet or carry a medical alert card that says you have had a PE. °· Ask your health care provider how soon you can go back to your normal activities. Stay active to prevent new blood clots from forming. °· Make sure to exercise while traveling or when you have been sitting or standing for a long period of time. It is very important to exercise. Exercise your legs by walking or by tightening and relaxing your leg muscles often. Take frequent walks. °· Wear compression stockings as told by your health care provider to help prevent more blood clots from forming. °· Do not use tobacco products, including cigarettes, chewing tobacco, and e-cigarettes. If you need help quitting, ask your health care provider. °· Keep all follow-up appointments with your health care provider. This is important. °How is this prevented? °Take these actions to decrease your risk of developing another PE: °· Exercise regularly. For at least 30 minutes every day, engage in: °? Activity that involves moving your arms and legs. °? Activity that encourages good blood flow through your body by increasing your heart rate. °· Exercise your arms and legs every hour during long-distance travel (over 4 hours). Drink plenty of water and avoid drinking alcohol while traveling. °· Avoid sitting or lying in bed for long periods of time without moving your legs. °· Maintain a weight that is appropriate for your height. Ask your health care provider what weight is healthy for you. °· If you are a woman who is over 35 years of age, avoid unnecessary use of medicines that contain estrogen. These include birth control pills. °· Do not smoke, especially if you take estrogen medicines. If you need help quitting, ask your health care provider. °· If you are at  very high risk for PE, wear compression stockings. °· If you recently had a PE, have regularly scheduled ultrasound testing on your legs to check for new blood clots. ° °If you are hospitalized, prevention measures may include: °· Early walking after surgery, as soon as your health care provider says that it is safe. °· Receiving anticoagulants to prevent blood clots. If you cannot take anticoagulants, other options may be available, such as wearing compression stockings or using different types of devices. ° °Get help right away if: °· You have new or increased pain, swelling, or redness in an arm or leg. °· You have numbness or tingling in an arm or leg. °· You have shortness of breath while active or at rest. °· You have chest pain. °· You have a rapid or irregular heartbeat. °· You feel light-headed or dizzy. °· You cough up blood. °· You notice blood in your vomit, bowel movement, or   urine. °· You have a fever. °These symptoms may represent a serious problem that is an emergency. Do not wait to see if the symptoms will go away. Get medical help right away. Call your local emergency services (911 in the [place] Do not drive yourself to the hospital. °This information is not intended to replace advice given to you by your health care provider. Make sure you discuss any questions you have with your health care provider. °Document Released: 11/19/2000 Document Revised: 04/29/2016 Document Reviewed: 03/19/2015 °Elsevier Interactive Patient Education © 2017 [employer] ° °

## 2016-12-31 NOTE — Care Management Note (Signed)
Case Management Note Marvetta Gibbons RN, BSN Unit 2W-Case Manager [phone]  Patient Details  Name: Crystal Spencer MRN: [id number] Date of Birth: October 14, 1976  Subjective/Objective:  Pt admitted with PE/DVT                   Action/Plan: PTA pt lived at home- plan to return home on Lovenox or 2wks- per insurance check- generic lovenox would be covered for $10 copay- spoke with pt at bedside- coverage info shared- per pt she would prefer to use [facility] on [address] - call made to pharmacy and they do have drug in stock to fill for 28 syringes. Pt also needs a PCP- discussed this with pt- [facility] # provided for physician referral assistance. Pt to call and see if she can find a practice to f/u with sometime next week. She also has OBGYN that she sees but needs PCP.   Expected Discharge Date:    12/31/16              Expected Discharge Plan:  Home/Self Care  In-House Referral:     Discharge planning Services  CM Consult, Medication Assistance  Post Acute Care Choice:  NA Choice offered to:  NA  DME Arranged:    DME Agency:     HH Arranged:    HH Agency:     Status of Service:  Completed, signed off  If discussed at [place] of Stay Meetings, dates discussed:    Discharge Disposition: home/self care   Additional Comments:  Dawayne Patricia, RN 12/31/2016, 11:31 AM

## 2017-01-05 DIAGNOSIS — E282 Polycystic ovarian syndrome: Secondary | ICD-10-CM | POA: Diagnosis not present

## 2017-01-05 DIAGNOSIS — I2699 Other pulmonary embolism without acute cor pulmonale: Secondary | ICD-10-CM | POA: Diagnosis not present

## 2017-01-05 DIAGNOSIS — Z6826 Body mass index (BMI) 26.0-26.9, adult: Secondary | ICD-10-CM | POA: Diagnosis not present

## 2017-01-05 DIAGNOSIS — I82409 Acute embolism and thrombosis of unspecified deep veins of unspecified lower extremity: Secondary | ICD-10-CM | POA: Diagnosis not present

## 2017-01-07 ENCOUNTER — Telehealth: Payer: Self-pay | Admitting: *Deleted

## 2017-01-07 NOTE — Telephone Encounter (Addendum)
Received message from pt's mother stating that she needs our help. Crystal Spencer was scheduled to have surgery on 2/20 and she had made flight reservation to be available to help Crystal Spencer after surgery. The surgery has been cancelled due to DVT and PE. She has cancelled her flight. The flight insurance will cover the ticket cost due to cancellation but she needs a form completed by the doctor. She understands that [name] will probably need to sign HIPPA form in order for this to happen. Please call back.    2/6  Message left for pt's mother Crystal Spencer) stating that we will need a signed release of information from [place]. Once that has been received, I will call her and she can fax the form she needs completed for the trip insurance. I stated that I will call and speak with [name] regarding this matter. I then called and was able to speak with [name]. She is aware of the situation and agreed to come to office tomorrow to sign ROI. I advised her of the fee required in order to complete the paperwork for her mother and she voiced understanding.

## 2017-01-13 ENCOUNTER — Encounter (HOSPITAL_COMMUNITY): Payer: Self-pay

## 2017-01-17 DIAGNOSIS — I82409 Acute embolism and thrombosis of unspecified deep veins of unspecified lower extremity: Secondary | ICD-10-CM | POA: Diagnosis not present

## 2017-01-20 ENCOUNTER — Encounter: Payer: Self-pay | Admitting: Obstetrics & Gynecology

## 2017-01-20 ENCOUNTER — Ambulatory Visit (INDEPENDENT_AMBULATORY_CARE_PROVIDER_SITE_OTHER): Payer: BLUE CROSS/BLUE SHIELD | Admitting: Obstetrics & Gynecology

## 2017-01-20 VITALS — BP 99/65 | HR 97 | Ht 64.0 in | Wt 158.9 lb

## 2017-01-20 DIAGNOSIS — D251 Intramural leiomyoma of uterus: Secondary | ICD-10-CM | POA: Diagnosis not present

## 2017-01-20 DIAGNOSIS — D252 Subserosal leiomyoma of uterus: Secondary | ICD-10-CM

## 2017-01-20 DIAGNOSIS — D250 Submucous leiomyoma of uterus: Secondary | ICD-10-CM | POA: Diagnosis not present

## 2017-01-20 MED ORDER — INTEGRA 62.5-62.5-40-3 MG PO CAPS: 1.0000 | ORAL_CAPSULE | Freq: Every day | ORAL | 6 refills | Status: DC

## 2017-01-20 NOTE — Patient Instructions (Signed)

## 2017-01-20 NOTE — Progress Notes (Signed)
History:  41 y.o. G0P0000 here today for f/u of AUB and uterine fibroids.  Pt was scheduled for a RATH but, she developed a DVT and PE and is now on Xeralto. She is in for f/u. She reports that she has had one period on the anticoagulant and it was heavier than her usual bleeding but,, not severe.  She wants to reschedule her surgery for May die to work reasons. She is concerned about her bleeding until that time. Pt is followed by Dr. Ernie Hew, [place].  The following portions of the patient's history were reviewed and updated as appropriate: allergies, current medications, past family history, past medical history, past social history, past surgical history and problem list.  Review of Systems:  Pertinent items are noted in HPI.   Objective:  Physical Exam Blood pressure 99/65, pulse 97, height 5\' 4"  (1.626 m), weight 158 lb 14.4 oz (72.1 kg), last menstrual period 01/04/2017.  BP 99/65   Pulse 97   Ht 5\' 4"  (1.626 m)   Wt 158 lb 14.4 oz (72.1 kg)   LMP 01/04/2017 (Within Days)   BMI 27.28 kg/m  CONSTITUTIONAL: Well-developed, well-nourished female in no acute distress.  HENT:  Normocephalic, atraumatic EYES: Conjunctivae and EOM are normal. No scleral icterus.  NECK: Normal range of motion SKIN: Skin is warm and dry. No rash noted. Not diaphoretic.No pallor. [place]: Alert and oriented to person, place, and time. Normal coordination.  Abd: NT, ND   CBC    Component Value Date/Time   WBC 7.8 12/31/2016 0110   RBC 4.08 12/31/2016 0110   HGB 11.2 (L) 12/31/2016 0110   HCT 34.5 (L) 12/31/2016 0110   PLT 367 12/31/2016 0110   MCV 84.6 12/31/2016 0110   MCH 27.5 12/31/2016 0110   MCHC 32.5 12/31/2016 0110   RDW 14.4 12/31/2016 0110     Assessment & Plan:  AUB/uterine fibroids now being tx'd for PE dn DVT.  Pt followed by Rachell Cipro, MD [facility]. I spoke with her regarding the timing of surgery and she thinks that 3 months should be fine.  She will cont to manage pt for  the primary care issues and can give a preop clearance.  Will reschedule [place] with bilateral salpingectomy for May 2018 Pt to f/u in 2 months or sooner prn excessively heavy menses. Pt instructed to NEVER take EES products.  Total face-to-face time with patient was 15 min.  Greater than 50% was spent in counseling and coordination of care with the patient.    Faydra Korman L. Harraway-Smith, M.D., Cherlynn June

## 2017-01-23 ENCOUNTER — Encounter: Payer: Self-pay | Admitting: Obstetrics & Gynecology

## 2017-01-26 DIAGNOSIS — D259 Leiomyoma of uterus, unspecified: Secondary | ICD-10-CM | POA: Diagnosis not present

## 2017-01-26 DIAGNOSIS — I2699 Other pulmonary embolism without acute cor pulmonale: Secondary | ICD-10-CM | POA: Diagnosis not present

## 2017-01-26 DIAGNOSIS — E282 Polycystic ovarian syndrome: Secondary | ICD-10-CM | POA: Diagnosis not present

## 2017-01-26 DIAGNOSIS — I82409 Acute embolism and thrombosis of unspecified deep veins of unspecified lower extremity: Secondary | ICD-10-CM | POA: Diagnosis not present

## 2017-02-09 ENCOUNTER — Encounter (HOSPITAL_COMMUNITY): Payer: Self-pay

## 2017-02-22 DIAGNOSIS — R7989 Other specified abnormal findings of blood chemistry: Secondary | ICD-10-CM | POA: Diagnosis not present

## 2017-02-25 DIAGNOSIS — I2699 Other pulmonary embolism without acute cor pulmonale: Secondary | ICD-10-CM | POA: Diagnosis not present

## 2017-02-25 DIAGNOSIS — Z6827 Body mass index (BMI) 27.0-27.9, adult: Secondary | ICD-10-CM | POA: Diagnosis not present

## 2017-02-25 DIAGNOSIS — D259 Leiomyoma of uterus, unspecified: Secondary | ICD-10-CM | POA: Diagnosis not present

## 2017-02-25 DIAGNOSIS — I82409 Acute embolism and thrombosis of unspecified deep veins of unspecified lower extremity: Secondary | ICD-10-CM | POA: Diagnosis not present

## 2017-03-30 ENCOUNTER — Ambulatory Visit: Payer: BLUE CROSS/BLUE SHIELD | Admitting: Obstetrics & Gynecology

## 2017-04-06 DIAGNOSIS — D649 Anemia, unspecified: Secondary | ICD-10-CM | POA: Diagnosis not present

## 2017-04-06 DIAGNOSIS — R7989 Other specified abnormal findings of blood chemistry: Secondary | ICD-10-CM | POA: Diagnosis not present

## 2017-04-08 ENCOUNTER — Other Ambulatory Visit: Payer: Self-pay | Admitting: Family Medicine

## 2017-04-08 DIAGNOSIS — I2699 Other pulmonary embolism without acute cor pulmonale: Secondary | ICD-10-CM

## 2017-04-08 DIAGNOSIS — D649 Anemia, unspecified: Secondary | ICD-10-CM | POA: Diagnosis not present

## 2017-04-08 DIAGNOSIS — D259 Leiomyoma of uterus, unspecified: Secondary | ICD-10-CM | POA: Diagnosis not present

## 2017-04-08 DIAGNOSIS — I82409 Acute embolism and thrombosis of unspecified deep veins of unspecified lower extremity: Secondary | ICD-10-CM | POA: Diagnosis not present

## 2017-04-11 ENCOUNTER — Ambulatory Visit
Admission: RE | Admit: 2017-04-11 | Discharge: 2017-04-11 | Disposition: A | Discharge status: Home / Self Care | Payer: BLUE CROSS/BLUE SHIELD | Source: Ambulatory Visit | Attending: Family Medicine | Admitting: Family Medicine

## 2017-04-11 DIAGNOSIS — I2699 Other pulmonary embolism without acute cor pulmonale: Secondary | ICD-10-CM | POA: Diagnosis not present

## 2017-04-11 MED ORDER — IOPAMIDOL (ISOVUE-300) INJECTION 61%
75.0000 mL | Freq: Once | INTRAVENOUS | Status: AC | PRN
  Administered 2017-04-11: 75 mL via INTRAVENOUS

## 2017-04-13 ENCOUNTER — Encounter (HOSPITAL_COMMUNITY): Payer: Self-pay

## 2017-04-25 ENCOUNTER — Telehealth: Payer: Self-pay | Admitting: Obstetrics & Gynecology

## 2017-04-25 NOTE — Telephone Encounter (Signed)
Patient stated she is scheduled for surgery on 07/03 with Dr. Ihor Dow, and wanted the date to stay on July 3rd., because her mother was flying in to keep her kids.

## 2017-04-27 ENCOUNTER — Other Ambulatory Visit: Payer: Self-pay | Admitting: Family Medicine

## 2017-04-27 DIAGNOSIS — R932 Abnormal findings on diagnostic imaging of liver and biliary tract: Secondary | ICD-10-CM

## 2017-05-10 DIAGNOSIS — D649 Anemia, unspecified: Secondary | ICD-10-CM | POA: Diagnosis not present

## 2017-05-12 DIAGNOSIS — D649 Anemia, unspecified: Secondary | ICD-10-CM | POA: Diagnosis not present

## 2017-05-12 DIAGNOSIS — I2699 Other pulmonary embolism without acute cor pulmonale: Secondary | ICD-10-CM | POA: Diagnosis not present

## 2017-05-12 DIAGNOSIS — D259 Leiomyoma of uterus, unspecified: Secondary | ICD-10-CM | POA: Diagnosis not present

## 2017-05-12 DIAGNOSIS — Z6827 Body mass index (BMI) 27.0-27.9, adult: Secondary | ICD-10-CM | POA: Diagnosis not present

## 2017-05-16 ENCOUNTER — Ambulatory Visit
Admission: RE | Admit: 2017-05-16 | Discharge: 2017-05-16 | Disposition: A | Discharge status: Home / Self Care | Payer: BLUE CROSS/BLUE SHIELD | Source: Ambulatory Visit | Attending: Family Medicine | Admitting: Family Medicine

## 2017-05-16 DIAGNOSIS — R932 Abnormal findings on diagnostic imaging of liver and biliary tract: Secondary | ICD-10-CM

## 2017-05-16 DIAGNOSIS — K769 Liver disease, unspecified: Secondary | ICD-10-CM | POA: Diagnosis not present

## 2017-05-16 MED ORDER — GADOBENATE DIMEGLUMINE 529 MG/ML IV SOLN
15.0000 mL | Freq: Once | INTRAVENOUS | Status: AC | PRN
  Administered 2017-05-16: 15 mL via INTRAVENOUS

## 2017-05-23 ENCOUNTER — Other Ambulatory Visit: Payer: Self-pay | Admitting: Obstetrics & Gynecology

## 2017-05-23 ENCOUNTER — Telehealth: Payer: Self-pay | Admitting: Obstetrics & Gynecology

## 2017-05-23 NOTE — Telephone Encounter (Signed)
TC to pt. She reports that she is off all anticoagulants at present. Pt had question about her preop care. Pt is not having reg cycles at present. Last menstrual period was 6 weeks. No chance of pregnancy. Pt has not been sexually active for >1 month.   Patient desires surgical management with robot assisted total lap hyst with bulateral salpingectomy possible abd hysterectomy for AUB and fibroids..  The risks of surgery were discussed in detail with the patient including but not limited to: bleeding which may require transfusion or reoperation; infection which may require prolonged hospitalization or re-hospitalization and antibiotic therapy; injury to bowel, bladder, ureters and major vessels or other surrounding organs; need for additional procedures including laparotomy; thromboembolic phenomenon, incisional problems and other postoperative or anesthesia complications.  Patient was told that the likelihood that her condition and symptoms will be treated effectively with this surgical management was very high; the postoperative expectations were also discussed in detail. The patient also understands the alternative treatment options which were discussed in full. All questions were answered.  Routine preoperative instructions of having nothing to eat or drink after midnight on the day prior to surgery and also coming to the hospital 1 1/2 hours prior to her time of surgery were also emphasized.  She was told she will be called for a preoperative appointment about a week prior to surgery and will be given further preoperative instructions at that visit.   clh-S

## 2017-06-01 NOTE — Patient Instructions (Signed)
Your procedure is scheduled on:  Tuesday, June 07, 2017  Enter through the [employer] [facility] at:  11:30 AM  Pick up the phone at the desk and dial [phone].  Call this number if you have problems the morning of surgery: [phone].  Remember:  Do NOT eat food:  After Midnight Monday  Do NOT drink clear liquids after:  7:00 AM Tuesday  Take these medicines the morning of surgery with a SIP OF WATER:  None  Stop ALL herbal medications at this time  Do NOT smoke the day of surgery.  Do NOT wear jewelry (body piercing), metal hair clips/bobby pins, make-up, artifical eyelashes or nail polish. Do NOT wear lotions, powders, or perfumes.  You may wear deodorant. Do NOT shave for 48 hours prior to surgery. Do NOT bring valuables to the hospital. Contacts, dentures, or bridgework may not be worn into surgery.  Leave suitcase in car.  After surgery it may be brought to your room.  For patients admitted to the hospital, checkout time is 11:00 AM the day of discharge.  Bring a copy of your healthcare power of attorney and living will documents.

## 2017-06-02 ENCOUNTER — Encounter (HOSPITAL_COMMUNITY)
Admission: RE | Admit: 2017-06-02 | Discharge: 2017-06-02 | Disposition: A | Discharge status: Home / Self Care | Payer: BLUE CROSS/BLUE SHIELD | Source: Ambulatory Visit | Attending: Obstetrics & Gynecology | Admitting: Obstetrics & Gynecology

## 2017-06-02 ENCOUNTER — Encounter (HOSPITAL_COMMUNITY): Payer: Self-pay

## 2017-06-02 DIAGNOSIS — Z01812 Encounter for preprocedural laboratory examination: Secondary | ICD-10-CM | POA: Diagnosis not present

## 2017-06-02 DIAGNOSIS — D259 Leiomyoma of uterus, unspecified: Secondary | ICD-10-CM | POA: Diagnosis not present

## 2017-06-02 HISTORY — DX: Headache: R51

## 2017-06-02 HISTORY — DX: Gastro-esophageal reflux disease without esophagitis: K21.9

## 2017-06-02 HISTORY — DX: Hypotension, unspecified: I95.9

## 2017-06-02 HISTORY — DX: Paresthesia of skin: R20.0

## 2017-06-02 HISTORY — DX: Paresthesia of skin: R20.2

## 2017-06-02 HISTORY — DX: Solitary pulmonary nodule: R91.1

## 2017-06-02 HISTORY — DX: Headache, unspecified: R51.9

## 2017-06-02 HISTORY — DX: Anemia, unspecified: D64.9

## 2017-06-02 HISTORY — DX: Pain in right ankle and joints of right foot: M25.571

## 2017-06-02 LAB — CBC
HEMATOCRIT: 37.1 % (ref 36.0–46.0)
Hemoglobin: 11.9 g/dL — ABNORMAL LOW (ref 12.0–15.0)
MCH: 25.6 pg — ABNORMAL LOW (ref 26.0–34.0)
MCHC: 32.1 g/dL (ref 30.0–36.0)
MCV: 79.8 fL (ref 78.0–100.0)
PLATELETS: 562 10*3/uL — AB (ref 150–400)
RBC: 4.65 MIL/uL (ref 3.87–5.11)
RDW: 19.1 % — AB (ref 11.5–15.5)
WBC: 5.5 10*3/uL (ref 4.0–10.5)

## 2017-06-02 LAB — TYPE AND SCREEN
ABO/RH(D): A POS
ANTIBODY SCREEN: NEGATIVE

## 2017-06-02 LAB — ABO/RH: ABO/RH(D): A POS

## 2017-06-07 ENCOUNTER — Ambulatory Visit (HOSPITAL_COMMUNITY): Payer: BLUE CROSS/BLUE SHIELD | Admitting: Certified Registered Nurse Anesthetist

## 2017-06-07 ENCOUNTER — Encounter (HOSPITAL_COMMUNITY): Payer: Self-pay

## 2017-06-07 ENCOUNTER — Encounter (HOSPITAL_COMMUNITY)
Admission: RE | Disposition: A | Discharge status: Home / Self Care | Payer: Self-pay | Source: Ambulatory Visit | Attending: Obstetrics & Gynecology

## 2017-06-07 ENCOUNTER — Observation Stay (HOSPITAL_COMMUNITY)
Admission: RE | Admit: 2017-06-07 | Discharge: 2017-06-08 | Disposition: A | Discharge status: Home / Self Care | Payer: BLUE CROSS/BLUE SHIELD | Source: Ambulatory Visit | Attending: Obstetrics & Gynecology | Admitting: Obstetrics & Gynecology

## 2017-06-07 DIAGNOSIS — R102 Pelvic and perineal pain: Secondary | ICD-10-CM | POA: Insufficient documentation

## 2017-06-07 DIAGNOSIS — D251 Intramural leiomyoma of uterus: Secondary | ICD-10-CM

## 2017-06-07 DIAGNOSIS — Z79899 Other long term (current) drug therapy: Secondary | ICD-10-CM | POA: Diagnosis not present

## 2017-06-07 DIAGNOSIS — Z820 Family history of epilepsy and other diseases of the nervous system: Secondary | ICD-10-CM | POA: Insufficient documentation

## 2017-06-07 DIAGNOSIS — F419 Anxiety disorder, unspecified: Secondary | ICD-10-CM | POA: Insufficient documentation

## 2017-06-07 DIAGNOSIS — N939 Abnormal uterine and vaginal bleeding, unspecified: Secondary | ICD-10-CM

## 2017-06-07 DIAGNOSIS — E282 Polycystic ovarian syndrome: Secondary | ICD-10-CM | POA: Diagnosis not present

## 2017-06-07 DIAGNOSIS — K219 Gastro-esophageal reflux disease without esophagitis: Secondary | ICD-10-CM | POA: Insufficient documentation

## 2017-06-07 DIAGNOSIS — Z823 Family history of stroke: Secondary | ICD-10-CM | POA: Insufficient documentation

## 2017-06-07 DIAGNOSIS — G43909 Migraine, unspecified, not intractable, without status migrainosus: Secondary | ICD-10-CM | POA: Diagnosis not present

## 2017-06-07 DIAGNOSIS — Z810 Family history of intellectual disabilities: Secondary | ICD-10-CM | POA: Insufficient documentation

## 2017-06-07 DIAGNOSIS — Z9889 Other specified postprocedural states: Secondary | ICD-10-CM

## 2017-06-07 DIAGNOSIS — Z880 Allergy status to penicillin: Secondary | ICD-10-CM | POA: Diagnosis not present

## 2017-06-07 DIAGNOSIS — G8929 Other chronic pain: Secondary | ICD-10-CM | POA: Diagnosis not present

## 2017-06-07 DIAGNOSIS — R911 Solitary pulmonary nodule: Secondary | ICD-10-CM | POA: Diagnosis not present

## 2017-06-07 DIAGNOSIS — Z86718 Personal history of other venous thrombosis and embolism: Secondary | ICD-10-CM | POA: Insufficient documentation

## 2017-06-07 DIAGNOSIS — Z888 Allergy status to other drugs, medicaments and biological substances status: Secondary | ICD-10-CM | POA: Insufficient documentation

## 2017-06-07 DIAGNOSIS — Z833 Family history of diabetes mellitus: Secondary | ICD-10-CM | POA: Diagnosis not present

## 2017-06-07 DIAGNOSIS — D259 Leiomyoma of uterus, unspecified: Secondary | ICD-10-CM | POA: Diagnosis not present

## 2017-06-07 DIAGNOSIS — Z809 Family history of malignant neoplasm, unspecified: Secondary | ICD-10-CM | POA: Insufficient documentation

## 2017-06-07 DIAGNOSIS — Z8249 Family history of ischemic heart disease and other diseases of the circulatory system: Secondary | ICD-10-CM | POA: Insufficient documentation

## 2017-06-07 DIAGNOSIS — Z885 Allergy status to narcotic agent status: Secondary | ICD-10-CM | POA: Insufficient documentation

## 2017-06-07 DIAGNOSIS — N938 Other specified abnormal uterine and vaginal bleeding: Secondary | ICD-10-CM | POA: Insufficient documentation

## 2017-06-07 HISTORY — PX: CYSTOSCOPY: SHX5120

## 2017-06-07 HISTORY — PX: ROBOTIC ASSISTED TOTAL HYSTERECTOMY WITH SALPINGECTOMY: SHX6679

## 2017-06-07 LAB — PREGNANCY, URINE: Preg Test, Ur: NEGATIVE

## 2017-06-07 SURGERY — ROBOTIC ASSISTED TOTAL HYSTERECTOMY WITH SALPINGECTOMY
Anesthesia: General | Site: Bladder

## 2017-06-07 MED ORDER — PANTOPRAZOLE SODIUM 40 MG PO TBEC
40.0000 mg | DELAYED_RELEASE_TABLET | Freq: Every day | ORAL | Status: DC
  Administered 2017-06-08: 40 mg via ORAL
  Filled 2017-06-07: qty 1

## 2017-06-07 MED ORDER — SUGAMMADEX SODIUM 200 MG/2ML IV SOLN
INTRAVENOUS | Status: DC | PRN
  Administered 2017-06-07: 200 mg via INTRAVENOUS

## 2017-06-07 MED ORDER — HYDROMORPHONE HCL 1 MG/ML IJ SOLN
0.2000 mg | INTRAMUSCULAR | Status: DC | PRN
  Administered 2017-06-07: 0.3 mg via INTRAVENOUS
  Filled 2017-06-07: qty 1

## 2017-06-07 MED ORDER — LIDOCAINE HCL (CARDIAC) 20 MG/ML IV SOLN
INTRAVENOUS | Status: DC | PRN
  Administered 2017-06-07: 100 mg via INTRAVENOUS

## 2017-06-07 MED ORDER — ONDANSETRON HCL 4 MG PO TABS: 4.0000 mg | ORAL_TABLET | Freq: Four times a day (QID) | ORAL | Status: DC | PRN

## 2017-06-07 MED ORDER — KETOROLAC TROMETHAMINE 30 MG/ML IJ SOLN
30.0000 mg | Freq: Four times a day (QID) | INTRAMUSCULAR | Status: DC
  Administered 2017-06-07 – 2017-06-08 (×3): 30 mg via INTRAVENOUS
  Filled 2017-06-07 (×3): qty 1

## 2017-06-07 MED ORDER — MENTHOL 3 MG MT LOZG: 1.0000 | LOZENGE | OROMUCOSAL | Status: DC | PRN

## 2017-06-07 MED ORDER — CEFOTETAN DISODIUM-DEXTROSE 2-2.08 GM-% IV SOLR: 2.0000 g | INTRAVENOUS | Status: DC

## 2017-06-07 MED ORDER — ROCURONIUM BROMIDE 100 MG/10ML IV SOLN
INTRAVENOUS | Status: DC | PRN
  Administered 2017-06-07 (×4): 10 mg via INTRAVENOUS
  Administered 2017-06-07: 40 mg via INTRAVENOUS

## 2017-06-07 MED ORDER — BUPIVACAINE HCL (PF) 0.5 % IJ SOLN
INTRAMUSCULAR | Status: DC | PRN
  Administered 2017-06-07: 30 mL

## 2017-06-07 MED ORDER — SCOPOLAMINE 1 MG/3DAYS TD PT72
MEDICATED_PATCH | TRANSDERMAL | Status: AC
  Administered 2017-06-07: 1.5 mg via TRANSDERMAL
  Filled 2017-06-07: qty 1

## 2017-06-07 MED ORDER — STERILE WATER FOR IRRIGATION IR SOLN
Status: DC | PRN
  Administered 2017-06-07: 1000 mL via INTRAVESICAL

## 2017-06-07 MED ORDER — CEFOTETAN DISODIUM-DEXTROSE 2-2.08 GM-% IV SOLR
INTRAVENOUS | Status: AC
  Filled 2017-06-07: qty 50

## 2017-06-07 MED ORDER — GENTAMICIN SULFATE 40 MG/ML IJ SOLN
INTRAMUSCULAR | Status: AC
  Administered 2017-06-07: 113.5 mL via INTRAVENOUS
  Filled 2017-06-07: qty 7.75

## 2017-06-07 MED ORDER — OXYCODONE-ACETAMINOPHEN 5-325 MG PO TABS
1.0000 | ORAL_TABLET | ORAL | Status: DC | PRN
  Administered 2017-06-07 (×2): 1 via ORAL
  Administered 2017-06-08: 2 via ORAL
  Filled 2017-06-07 (×2): qty 2

## 2017-06-07 MED ORDER — PROPOFOL 10 MG/ML IV BOLUS
INTRAVENOUS | Status: DC | PRN
  Administered 2017-06-07: 150 mg via INTRAVENOUS

## 2017-06-07 MED ORDER — MIDAZOLAM HCL 2 MG/2ML IJ SOLN
INTRAMUSCULAR | Status: DC | PRN
  Administered 2017-06-07: 2 mg via INTRAVENOUS

## 2017-06-07 MED ORDER — LACTATED RINGERS IR SOLN
Status: DC | PRN
  Administered 2017-06-07: 3000 mL

## 2017-06-07 MED ORDER — ONDANSETRON HCL 4 MG/2ML IJ SOLN: 4.0000 mg | Freq: Four times a day (QID) | INTRAMUSCULAR | Status: DC | PRN

## 2017-06-07 MED ORDER — FENTANYL CITRATE (PF) 100 MCG/2ML IJ SOLN
INTRAMUSCULAR | Status: AC
Start: 2017-06-07 — End: 2017-06-08
  Filled 2017-06-07: qty 2

## 2017-06-07 MED ORDER — DEXTROSE-NACL 5-0.45 % IV SOLN
INTRAVENOUS | Status: DC
  Administered 2017-06-07 – 2017-06-08 (×2): via INTRAVENOUS

## 2017-06-07 MED ORDER — METHYLENE BLUE 0.5 % INJ SOLN
INTRAVENOUS | Status: AC
  Filled 2017-06-07: qty 10

## 2017-06-07 MED ORDER — DEXAMETHASONE SODIUM PHOSPHATE 10 MG/ML IJ SOLN
INTRAMUSCULAR | Status: DC | PRN
  Administered 2017-06-07: 8 mg via INTRAVENOUS

## 2017-06-07 MED ORDER — ONDANSETRON HCL 4 MG/2ML IJ SOLN
INTRAMUSCULAR | Status: DC | PRN
  Administered 2017-06-07: 4 mg via INTRAVENOUS

## 2017-06-07 MED ORDER — METHYLENE BLUE 0.5 % INJ SOLN
INTRAVENOUS | Status: DC | PRN
  Administered 2017-06-07: 5 mL via INTRAVENOUS

## 2017-06-07 MED ORDER — LACTATED RINGERS IV SOLN
INTRAVENOUS | Status: DC
  Administered 2017-06-07 (×2): via INTRAVENOUS

## 2017-06-07 MED ORDER — ENOXAPARIN SODIUM 40 MG/0.4ML ~~LOC~~ SOLN
40.0000 mg | SUBCUTANEOUS | Status: DC
  Administered 2017-06-08: 40 mg via SUBCUTANEOUS
  Filled 2017-06-07 (×2): qty 0.4

## 2017-06-07 MED ORDER — KETOROLAC TROMETHAMINE 30 MG/ML IJ SOLN: 30.0000 mg | Freq: Four times a day (QID) | INTRAMUSCULAR | Status: DC

## 2017-06-07 MED ORDER — EPHEDRINE SULFATE 50 MG/ML IJ SOLN
INTRAMUSCULAR | Status: DC | PRN
  Administered 2017-06-07 (×3): 5 mg via INTRAVENOUS

## 2017-06-07 MED ORDER — LACTATED RINGERS IV SOLN: INTRAVENOUS | Status: DC

## 2017-06-07 MED ORDER — DOCUSATE SODIUM 100 MG PO CAPS
100.0000 mg | ORAL_CAPSULE | Freq: Two times a day (BID) | ORAL | Status: DC
  Administered 2017-06-07 – 2017-06-08 (×2): 100 mg via ORAL
  Filled 2017-06-07 (×2): qty 1

## 2017-06-07 MED ORDER — KETOROLAC TROMETHAMINE 30 MG/ML IJ SOLN
INTRAMUSCULAR | Status: DC | PRN
  Administered 2017-06-07: 30 mg via INTRAVENOUS

## 2017-06-07 MED ORDER — FENTANYL CITRATE (PF) 100 MCG/2ML IJ SOLN
INTRAMUSCULAR | Status: DC | PRN
  Administered 2017-06-07 (×5): 50 ug via INTRAVENOUS

## 2017-06-07 MED ORDER — SIMETHICONE 80 MG PO CHEW: 80.0000 mg | CHEWABLE_TABLET | Freq: Four times a day (QID) | ORAL | Status: DC | PRN

## 2017-06-07 MED ORDER — ZOLPIDEM TARTRATE 5 MG PO TABS: 5.0000 mg | ORAL_TABLET | Freq: Every evening | ORAL | Status: DC | PRN

## 2017-06-07 MED ORDER — PHENYLEPHRINE HCL 10 MG/ML IJ SOLN
INTRAMUSCULAR | Status: DC | PRN
  Administered 2017-06-07 (×6): .04 mg via INTRAVENOUS

## 2017-06-07 MED ORDER — SCOPOLAMINE 1 MG/3DAYS TD PT72
1.0000 | MEDICATED_PATCH | Freq: Once | TRANSDERMAL | Status: DC
  Administered 2017-06-07: 1.5 mg via TRANSDERMAL

## 2017-06-07 MED ORDER — FENTANYL CITRATE (PF) 100 MCG/2ML IJ SOLN
25.0000 ug | INTRAMUSCULAR | Status: DC | PRN
  Administered 2017-06-07 (×3): 50 ug via INTRAVENOUS

## 2017-06-07 SURGICAL SUPPLY — 64 items
APPLICATOR ARISTA FLEXITIP XL (MISCELLANEOUS) IMPLANT
APPLIER CLIP 5 13 M/L LIGAMAX5 (MISCELLANEOUS)
BARRIER ADHS 3X4 INTERCEED (GAUZE/BANDAGES/DRESSINGS) IMPLANT
CATH FOLEY 3WAY  5CC 16FR (CATHETERS) ×1
CATH FOLEY 3WAY 5CC 16FR (CATHETERS) ×2 IMPLANT
CLIP APPLIE 5 13 M/L LIGAMAX5 (MISCELLANEOUS) IMPLANT
CLOTH BEACON ORANGE TIMEOUT ST (SAFETY) ×3 IMPLANT
CONT PATH 16OZ SNAP LID 3702 (MISCELLANEOUS) ×3 IMPLANT
COVER BACK TABLE 60X90IN (DRAPES) ×6 IMPLANT
COVER TIP SHEARS 8 DVNC (MISCELLANEOUS) ×2 IMPLANT
COVER TIP SHEARS 8MM DA VINCI (MISCELLANEOUS) ×1
DECANTER SPIKE VIAL GLASS SM (MISCELLANEOUS) ×3 IMPLANT
DERMABOND ADVANCED (GAUZE/BANDAGES/DRESSINGS) ×1
DERMABOND ADVANCED .7 DNX12 (GAUZE/BANDAGES/DRESSINGS) ×2 IMPLANT
DURAPREP 26ML APPLICATOR (WOUND CARE) ×3 IMPLANT
ELECT REM PT RETURN 9FT ADLT (ELECTROSURGICAL) ×3
ELECTRODE REM PT RTRN 9FT ADLT (ELECTROSURGICAL) ×2 IMPLANT
GAUZE VASELINE 3X9 (GAUZE/BANDAGES/DRESSINGS) IMPLANT
GLOVE BIO SURGEON STRL SZ7 (GLOVE) ×6 IMPLANT
GLOVE BIOGEL PI IND STRL 7.0 (GLOVE) ×10 IMPLANT
GLOVE BIOGEL PI INDICATOR 7.0 (GLOVE) ×5
GYRUS RUMI II 2.5CM BLUE (DISPOSABLE)
GYRUS RUMI II 3.5CM BLUE (DISPOSABLE)
GYRUS RUMI II 4.0CM BLUE (DISPOSABLE)
HEMOSTAT ARISTA ABSORB 3G PWDR (MISCELLANEOUS) IMPLANT
KIT ACCESSORY DA VINCI DISP (KITS) ×1
KIT ACCESSORY DVNC DISP (KITS) ×2 IMPLANT
LEGGING LITHOTOMY PAIR STRL (DRAPES) ×3 IMPLANT
NEEDLE INSUFFLATION 120MM (ENDOMECHANICALS) ×3 IMPLANT
OCCLUDER COLPOPNEUMO (BALLOONS) ×3 IMPLANT
PACK ROBOT WH (CUSTOM PROCEDURE TRAY) ×3 IMPLANT
PACK ROBOTIC GOWN (GOWN DISPOSABLE) ×3 IMPLANT
PACK TRENDGUARD 450 HYBRID PRO (MISCELLANEOUS) ×2 IMPLANT
PACK TRENDGUARD 600 HYBRD PROC (MISCELLANEOUS) IMPLANT
PAD PREP 24X48 CUFFED NSTRL (MISCELLANEOUS) ×3 IMPLANT
POUCH LAPAROSCOPIC INSTRUMENT (MISCELLANEOUS) ×3 IMPLANT
PROTECTOR NERVE ULNAR (MISCELLANEOUS) ×9 IMPLANT
RUMI II 3.0CM BLUE KOH-EFFICIE (DISPOSABLE) ×3 IMPLANT
RUMI II GYRUS 2.5CM BLUE (DISPOSABLE) IMPLANT
RUMI II GYRUS 3.5CM BLUE (DISPOSABLE) IMPLANT
RUMI II GYRUS 4.0CM BLUE (DISPOSABLE) IMPLANT
SEALER ENDOWRIST ONE VESSEL (MISCELLANEOUS) ×3 IMPLANT
SET CYSTO W/LG BORE CLAMP LF (SET/KITS/TRAYS/PACK) IMPLANT
SET IRRIG TUBING LAPAROSCOPIC (IRRIGATION / IRRIGATOR) ×3 IMPLANT
SET TRI-LUMEN FLTR TB AIRSEAL (TUBING) ×3 IMPLANT
SUT VIC AB 0 CT1 27 (SUTURE) ×2
SUT VIC AB 0 CT1 27XBRD ANBCTR (SUTURE) ×4 IMPLANT
SUT VICRYL 0 UR6 27IN ABS (SUTURE) ×6 IMPLANT
SUT VICRYL 4-0 PS2 18IN ABS (SUTURE) ×6 IMPLANT
SUT VLOC 180 0 9IN  GS21 (SUTURE) ×1
SUT VLOC 180 0 9IN GS21 (SUTURE) ×2 IMPLANT
SYSTEM CARTER THOMASON II (TROCAR) IMPLANT
TIP RUMI ORANGE 6.7MMX12CM (TIP) ×3 IMPLANT
TIP UTERINE 5.1X6CM LAV DISP (MISCELLANEOUS) IMPLANT
TIP UTERINE 6.7X10CM GRN DISP (MISCELLANEOUS) IMPLANT
TIP UTERINE 6.7X6CM WHT DISP (MISCELLANEOUS) IMPLANT
TIP UTERINE 6.7X8CM BLUE DISP (MISCELLANEOUS) IMPLANT
TOWEL OR 17X24 6PK STRL BLUE (TOWEL DISPOSABLE) ×9 IMPLANT
TRENDGUARD 450 HYBRID PRO PACK (MISCELLANEOUS) ×3
TRENDGUARD 600 HYBRID PROC PK (MISCELLANEOUS)
TROCAR DILATING TIP 12MM 150MM (ENDOMECHANICALS) ×3 IMPLANT
TROCAR DISP BLADELESS 8 DVNC (TROCAR) ×2 IMPLANT
TROCAR DISP BLADELESS 8MM (TROCAR) ×1
TROCAR PORT AIRSEAL 5X120 (TROCAR) ×3 IMPLANT

## 2017-06-07 NOTE — Brief Op Note (Signed)
06/07/2017  3:58 PM  PATIENT:  Crystal Spencer  41 y.o. female  PRE-OPERATIVE DIAGNOSIS:  - Large symptomatic uterine fibroids  POST-OPERATIVE DIAGNOSIS:   Large symptomatic uterine fibroids  PROCEDURE:  Procedure(s): ROBOTIC ASSISTED TOTAL HYSTERECTOMY WITH SALPINGECTOMY (Bilateral) CYSTOSCOPY (N/A)  SURGEON:  Surgeon(s) and Role:    * Lavonia Drafts, MD - Primary    * Anyanwu, Sallyanne Havers, MD - Assisting  ANESTHESIA:   general  EBL:  Total I/O In: 1700 [I.V.:1700] Out: 225 [Urine:125; Blood:100]  BLOOD ADMINISTERED:none  DRAINS: none   LOCAL MEDICATIONS USED:  MARCAINE     SPECIMEN:  Source of Specimen:  uterus, cervix and bilateral fallopian tubes  DISPOSITION OF SPECIMEN:  PATHOLOGY  COUNTS:  YES  TOURNIQUET:  * No tourniquets in log *  DICTATION: .Note written in EPIC  PLAN OF CARE: Admit for overnight observation  PATIENT DISPOSITION:  PACU - hemodynamically stable.   Delay start of Pharmacological VTE agent (>24hrs) due to surgical blood loss or risk of bleeding: yes  Complications: none immediate  Philipe Laswell L. Harraway-Smith, M.D., [place]lynn June

## 2017-06-07 NOTE — Op Note (Signed)
06/07/2017  3:58 PM  PATIENT:  Crystal Spencer  41 y.o. female  PRE-OPERATIVE DIAGNOSIS:  - Large symptomatic uterine fibroids; AUB; Pelvic pain  POST-OPERATIVE DIAGNOSIS:   Large symptomatic uterine fibroids; AUB; Pelvic pain  PROCEDURE:  Procedure(s): ROBOTIC ASSISTED TOTAL HYSTERECTOMY WITH SALPINGECTOMY (Bilateral) CYSTOSCOPY (N/A)  SURGEON:  Surgeon(s) and Role:    * Lavonia Drafts, MD - Primary    * Anyanwu, Sallyanne Havers, MD - Assisting  ANESTHESIA:   general  EBL:  Total I/O In: 1700 [I.V.:1700] Out: 225 [Urine:125; Blood:100]  BLOOD ADMINISTERED:none  DRAINS: none   LOCAL MEDICATIONS USED:  MARCAINE     SPECIMEN:  Source of Specimen:  uterus, cervix and bilateral fallopian tubes  DISPOSITION OF SPECIMEN:  PATHOLOGY  COUNTS:  YES  TOURNIQUET:  * No tourniquets in log *  DICTATION: .Note written in EPIC  PLAN OF CARE: Admit for overnight observation  PATIENT DISPOSITION:  PACU - hemodynamically stable.   Delay start of Pharmacological VTE agent (>24hrs) due to surgical blood loss or risk of bleeding: yes  Complications: none immediate  The risks, benefits, and alternatives of surgery were explained, understood, and accepted. Consents were signed. All questions were answered. She was taken to the operating room and general anesthesia was applied without complication. She was placed in the dorsal lithotomy position and her abdomen and vagina were prepped and draped after she had been carefully positioned on the table. A bimanual exam revealed a 16-17 week size uterus that was mobile. Her adnexa were not enlarged. The cervix was measured and the uterus was sounded to 12 cm. A Rumi uterine manipulator was placed without difficulty. A Foley catheter was placed and it drained clear throughout the case. Gloves were changed and attention was turned to the abdomen. A 39mm incision was made in the umbilicus and a Veress needle was placed intraperitoneally. CO2 was used  to insufflate the abdomen to approximately 4 L. After good pneumoperitoneum was established, a 12 mm trocar was placed 5 cm above the umbilicus.  Laparoscopy confirmed correct placement. She was placed in Trendelenburg position and ports were placed in appropriate positions on her abdomen to allow maximum exposure during the robotic case. Specifically there was an 65mm assistant port placed in the left lower quadrant under direct laproscopic visualization. Two 8 mm ports were placed 8cm lateral to the midline port.  These were all placed under direct laparoscopic visualization. The robot was docked and I proceeded with a robotic portion of the case.  The pelvis was inspected and the uterus was found to have fibroids and be enlarged.  The fallopian tubes and ovaries were found to be normal. The remainder of her pelvis appeared normal. The ureters and the infundibulopelvic ligaments were identified. The round ligament on each side was cauterized and cut. The Vessel sealer instrument was used for this portion. The round ligaments were identified, cauterized and ligated, a bladder flap was created anteriorly. The uterine vessels were identified and cauterized and then cut also using the vessel sealer. The bladder was pushed out of the operative site and an anterior colpotomy was made. The colpotomy incision was extended circumferentially, following the blue outline of the Rumi manipulator. All pedicles were hemostatic.   I excised the fallopian tubes bilaterally. Due to its size, the uterus had to be morcellated through the vagina. It was then removed from the vagina with the fallopian tube segments. The vaginal cuff was closed with v-lock suture.  Excellent hemostasis was noted throughout. The pelvis  was irrigated. The intraabdominal pressure was lowered assess hemostasis. After determining excellent hemostasis, the robot was undocked. At this point I performed cystoscopy. The cystoscopy revealed flow from both  ureteral orifices.  The midline fascial incision was closed with 0 vicryl.  The skin from all of the other ports was closed with 3-0 vicryl and Dermabond glue. 30cc of 0.5% Marcaine was injected into the port sites.  The patient was then extubated and taken to recovery in stable condition.   Sponge, lap and needle counts were correct x 2.  Kirstin Kugler L. Harraway-Smith, M.D., [place]lynn June

## 2017-06-07 NOTE — H&P (Signed)
Preoperative History and Physical  Crystal Spencer is a 41 y.o. G0P0000 here for surgical management of uterine fibroids  Proposed surgery: Robot assisted total laparoscopic hysterectomy with bilateral salpingectomy  Past Medical History:  Diagnosis Date  . Allergy   . Anemia    borderline  . Ankle pain, right   . Anxiety   . GERD (gastroesophageal reflux disease)   . Headache    Migraines  . Low blood pressure   . Lung nodule   . Numbness and tingling of both feet   . PE (pulmonary thromboembolism) ([place]) 12/29/2016  . Polycystic ovarian syndrome   . Uterine fibroid    Past Surgical History:  Procedure Laterality Date  . APPENDECTOMY    . EYE SURGERY    . TONSILLECTOMY     OB History    Gravida Para Term Preterm AB Living   0 0 0 0 0 0   SAB TAB Ectopic Multiple Live Births   0 0 0 0       Patient denies any cervical dysplasia or STIs. Prescriptions Prior to Admission  Medication Sig Dispense Refill Last Dose  . Fe Fum-FePoly-Vit C-Vit B3 (INTEGRA) 62.5-62.5-40-3 MG CAPS Take 1 capsule by mouth daily. 30 capsule 6 Past Week at Unknown time  . fluticasone (FLONASE) 50 MCG/ACT nasal spray Place 2 sprays into both nostrils daily. 16 g 0 06/06/2017 at Unknown time  . ibuprofen (ADVIL,MOTRIN) 200 MG tablet Take 400 mg by mouth every 8 (eight) hours as needed for headache or mild pain.   Past Week at Unknown time  . enoxaparin (LOVENOX) 80 MG/0.8ML injection Inject 0.7 mLs (70 mg total) into the skin every 12 (twelve) hours. 28 Syringe 0   . guaiFENesin (MUCINEX) 600 MG 12 hr tablet Take 1 tablet (600 mg total) by mouth 2 (two) times daily. (Patient not taking: Reported on 05/30/2017) 30 tablet 0 Not Taking at Unknown time    Allergies  Allergen Reactions  . Estrogens Other (See Comments)    DVT and PE on OCP's  . Hydrocodone Nausea And Vomiting  . Amoxicillin Rash    Has patient had a PCN reaction causing immediate rash, facial/tongue/throat swelling, SOB or lightheadedness  with hypotension: Unknown Has patient had a PCN reaction causing severe rash involving mucus membranes or skin necrosis: No Has patient had a PCN reaction that required hospitalization: No Has patient had a PCN reaction occurring within the last 10 years: Yes If all of the above answers are "NO", then may proceed with Cephalosporin use.    Social History:   reports that she has never smoked. She has never used smokeless tobacco. She reports that she drinks alcohol. She reports that she does not use drugs. Family History  Problem Relation Age of Onset  . Hyperlipidemia Brother   . Cancer Maternal Grandmother   . Diabetes Maternal Grandmother   . Hearing loss Maternal Grandmother   . Heart disease Maternal Grandfather   . Stroke Maternal Grandfather   . Diabetes Paternal Grandmother   . Stroke Paternal Grandfather   . Mental retardation Paternal Grandfather     Review of Systems: Noncontributory  PHYSICAL EXAM: Blood pressure 116/64, pulse 72, temperature 98.7 F (37.1 C), temperature source Oral, resp. rate 16, last menstrual period 05/16/2017, SpO2 100 %. General appearance - alert, well appearing, and in no distress Chest - clear to auscultation, no wheezes, rales or rhonchi, symmetric air entry Heart - normal rate and regular rhythm Abdomen - soft, nontender, nondistended, no  masses or organomegaly Pelvic - examination not indicated Extremities - peripheral pulses normal, no pedal edema, no clubbing or cyanosis  Labs: Results for orders placed or performed during the hospital encounter of 06/07/17 (from the past 336 hour(s))  Pregnancy, urine   Collection Time: 06/07/17 11:30 AM  Result Value Ref Range   Preg Test, Ur NEGATIVE NEGATIVE  Results for orders placed or performed during the hospital encounter of 06/02/17 (from the past 336 hour(s))  CBC   Collection Time: 06/02/17 11:25 AM  Result Value Ref Range   WBC 5.5 4.0 - 10.5 K/uL   RBC 4.65 3.87 - 5.11 MIL/uL    Hemoglobin 11.9 (L) 12.0 - 15.0 g/dL   HCT 37.1 36.0 - 46.0 %   MCV 79.8 78.0 - 100.0 fL   MCH 25.6 (L) 26.0 - 34.0 pg   MCHC 32.1 30.0 - 36.0 g/dL   RDW 19.1 (H) 11.5 - 15.5 %   Platelets 562 (H) 150 - 400 K/uL  Type and screen [place]   Collection Time: 06/02/17 11:25 AM  Result Value Ref Range   ABO/RH(D) A POS    Antibody Screen NEG    Sample Expiration 06/16/2017    Extend sample reason NO TRANSFUSIONS OR PREGNANCY IN THE PAST 3 MONTHS   ABO/Rh   Collection Time: 06/02/17 11:25 AM  Result Value Ref Range   ABO/RH(D) A POS     Imaging Studies: Mr Abdomen Wwo Contrast  Result Date: 05/16/2017 CLINICAL DATA:  Indeterminate incidental hyperenhancing right liver lesion on recent chest CT. EXAM: MRI ABDOMEN WITHOUT AND WITH CONTRAST TECHNIQUE: Multiplanar multisequence MR imaging of the abdomen was performed both before and after the administration of intravenous contrast. CONTRAST:  55mL MULTIHANCE GADOBENATE DIMEGLUMINE 529 MG/ML IV SOLN COMPARISON:  04/11/2017 chest CT. FINDINGS: Lower chest: Peripheral basilar left lower lobe 4 mm pulmonary nodule (series 12/image 18) is stable since 12/29/2016 and probably benign. No acute abnormality at the lung bases. Hepatobiliary: Normal liver size and configuration. No hepatic steatosis. There is a 1.6 x 1.3 cm peripheral right liver lobe mass (series 12/ image 28), which demonstrates minimal T2 hyperintensity, brisk arterial phase hyperenhancement, portal venous phase near isointensity and delayed phase 0.6 cm central focus of enhancement, with no intralesional lipid, most compatible with focal nodular hyperplasia. There is a posterior right liver lobe 2.0 x 1.7 cm mass (series 10/ image 22) with progressed discontinuous peripheral nodular enhancement, compatible with a benign hemangioma. No additional liver lesions. Normal gallbladder with no cholelithiasis. No biliary ductal dilatation. Common bile duct diameter 5 mm. No  choledocholithiasis. Pancreas: No pancreatic mass or duct dilation.  No pancreas divisum. Spleen: Normal size. No mass. Adrenals/Urinary Tract: Normal adrenals. No hydronephrosis. Subcentimeter simple renal cyst in the anterior interpolar right kidney. Otherwise normal kidneys with no suspicious renal masses. Stomach/Bowel: Grossly normal stomach. Visualized small and large bowel is normal caliber, with no bowel wall thickening. Vascular/Lymphatic: Normal caliber abdominal aorta. Patent portal, splenic, hepatic and renal veins. No pathologically enlarged lymph nodes in the abdomen. Other: No abdominal ascites or focal fluid collection. Partial visualization of a prominently enlarged myomatous uterus with partially visualized degenerated nonenhancing 8.4 cm posterior uterine body and enhancing 7.4 cm anterior uterine body fibroids. Musculoskeletal: No aggressive appearing focal osseous lesions. IMPRESSION: 1. Hyperenhancing 1.6 cm peripheral right liver lobe mass with MRI features most compatible with focal nodular hyperplasia ([place]). A single follow-up MRI abdomen without and with IV contrast with Eovist is recommended in 6  months to document stability and confirm this diagnosis. 2. 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 3. Left lung base 4 mm pulmonary nodule, for which 4 month stability has been demonstrated, probably benign. No follow-up needed if patient is low-risk. Non-contrast chest CT can be considered in 12 months if patient is high-risk. This recommendation follows the consensus statement: Guidelines for Management of Incidental Pulmonary Nodules Detected on CT Images:From the Fleischner Society 2017; published online before print (10.1148/radiol.[id number]). 4. Bulky myomatous uterus, only partially visualized and incompletely evaluated on this scan. Electronically Signed   By: Ilona Sorrel M.D.   On: 05/16/2017 09:43    Assessment: Patient Active Problem List   Diagnosis Date  Noted  . Pulmonary embolism ([place]) 12/29/2016    Plan: Patient will undergo surgical management with total laparoscopic hysterectomy with bilateral salpingectomy.   The risks of surgery were discussed in detail with the patient including but not limited to: bleeding which may require transfusion or reoperation; infection which may require antibiotics; injury to surrounding organs which may involve bowel, bladder, ureters ; need for additional procedures including laparoscopy or laparotomy; thromboembolic phenomenon, surgical site problems and other postoperative/anesthesia complications. Likelihood of success in alleviating the patient's condition was discussed. Routine postoperative instructions will be reviewed with the patient and her family in detail after surgery.  The patient concurred with the proposed plan, giving informed written consent for the surgery.  Patient has been NPO since last night she will remain NPO for procedure.  Anesthesia and OR aware.  Preoperative prophylactic antibiotics and SCDs ordered on call to the OR.  To OR when ready.  Leanor Voris L. Harraway-Smith, M.D., [place] 06/07/2017 12:00 PM

## 2017-06-07 NOTE — Anesthesia Procedure Notes (Signed)
Procedure Name: Intubation Date/Time: 06/07/2017 12:32 PM Performed by: Bufford Spikes Pre-anesthesia Checklist: Patient identified, Emergency Drugs available, Suction available and Patient being monitored Patient Re-evaluated:Patient Re-evaluated prior to inductionOxygen Delivery Method: Circle system utilized Preoxygenation: Pre-oxygenation with 100% oxygen Intubation Type: IV induction Ventilation: Mask ventilation without difficulty Laryngoscope Size: Miller and 2 Grade View: Grade I Tube type: Oral Tube size: 7.0 mm Number of attempts: 1 Airway Equipment and Method: Stylet and Oral airway Placement Confirmation: ETT inserted through vocal cords under direct vision,  positive ETCO2 and breath sounds checked- equal and bilateral Secured at: 21 cm Tube secured with: Tape Dental Injury: Teeth and Oropharynx as per pre-operative assessment

## 2017-06-07 NOTE — Anesthesia Preprocedure Evaluation (Signed)
Anesthesia Evaluation  Patient identified by MRN, date of birth, ID band Patient awake    Reviewed: Allergy & Precautions, H&P , Patient's Chart, lab work & pertinent test results, reviewed documented beta blocker date and time   Airway Mallampati: II  TM Distance: >3 FB Neck ROM: full    Dental no notable dental hx.    Pulmonary    Pulmonary exam normal breath sounds clear to auscultation       Cardiovascular  Rhythm:regular Rate:Normal     Neuro/Psych    GI/Hepatic   Endo/Other    Renal/GU      Musculoskeletal   Abdominal   Peds  Hematology   Anesthesia Other Findings   Reproductive/Obstetrics                             Anesthesia Physical Anesthesia Plan  ASA: II  Anesthesia Plan: General   Post-op Pain Management:    Induction: Intravenous  PONV Risk Score and Plan:   Airway Management Planned: Oral ETT  Additional Equipment:   Intra-op Plan:   Post-operative Plan: Extubation in OR  Informed Consent: I have reviewed the patients History and Physical, chart, labs and discussed the procedure including the risks, benefits and alternatives for the proposed anesthesia with the patient or authorized representative who has indicated his/her understanding and acceptance.   Dental Advisory Given  Plan Discussed with: CRNA and Surgeon  Anesthesia Plan Comments: (  )        Anesthesia Quick Evaluation  

## 2017-06-07 NOTE — Transfer of Care (Signed)
Immediate Anesthesia Transfer of Care Note  Patient: Crystal Spencer  Procedure(s) Performed: Procedure(s): ROBOTIC ASSISTED TOTAL HYSTERECTOMY WITH SALPINGECTOMY (Bilateral) CYSTOSCOPY (N/A)  Patient Location: PACU  Anesthesia Type:General  Level of Consciousness:  sedated, patient cooperative and responds to stimulation  Airway & Oxygen Therapy:Patient Spontanous Breathing and Patient connected to face mask oxgen  Post-op Assessment:  Report given to PACU RN and Post -op Vital signs reviewed and stable  Post vital signs:  Reviewed and stable  Last Vitals:  Vitals:   06/07/17 1143  BP: 116/64  Pulse: 72  Resp: 16  Temp: 65.7 C    Complications: No apparent anesthesia complications

## 2017-06-08 DIAGNOSIS — R102 Pelvic and perineal pain: Secondary | ICD-10-CM | POA: Diagnosis not present

## 2017-06-08 DIAGNOSIS — Z8249 Family history of ischemic heart disease and other diseases of the circulatory system: Secondary | ICD-10-CM | POA: Diagnosis not present

## 2017-06-08 DIAGNOSIS — N938 Other specified abnormal uterine and vaginal bleeding: Secondary | ICD-10-CM | POA: Diagnosis not present

## 2017-06-08 DIAGNOSIS — D259 Leiomyoma of uterus, unspecified: Secondary | ICD-10-CM | POA: Diagnosis not present

## 2017-06-08 DIAGNOSIS — D251 Intramural leiomyoma of uterus: Secondary | ICD-10-CM

## 2017-06-08 DIAGNOSIS — K219 Gastro-esophageal reflux disease without esophagitis: Secondary | ICD-10-CM | POA: Diagnosis not present

## 2017-06-08 DIAGNOSIS — G43909 Migraine, unspecified, not intractable, without status migrainosus: Secondary | ICD-10-CM | POA: Diagnosis not present

## 2017-06-08 DIAGNOSIS — E282 Polycystic ovarian syndrome: Secondary | ICD-10-CM | POA: Diagnosis not present

## 2017-06-08 DIAGNOSIS — G8929 Other chronic pain: Secondary | ICD-10-CM

## 2017-06-08 DIAGNOSIS — Z86718 Personal history of other venous thrombosis and embolism: Secondary | ICD-10-CM | POA: Diagnosis not present

## 2017-06-08 DIAGNOSIS — F419 Anxiety disorder, unspecified: Secondary | ICD-10-CM | POA: Diagnosis not present

## 2017-06-08 DIAGNOSIS — N939 Abnormal uterine and vaginal bleeding, unspecified: Secondary | ICD-10-CM | POA: Diagnosis not present

## 2017-06-08 DIAGNOSIS — Z880 Allergy status to penicillin: Secondary | ICD-10-CM | POA: Diagnosis not present

## 2017-06-08 DIAGNOSIS — Z833 Family history of diabetes mellitus: Secondary | ICD-10-CM | POA: Diagnosis not present

## 2017-06-08 DIAGNOSIS — Z79899 Other long term (current) drug therapy: Secondary | ICD-10-CM | POA: Diagnosis not present

## 2017-06-08 DIAGNOSIS — Z888 Allergy status to other drugs, medicaments and biological substances status: Secondary | ICD-10-CM | POA: Diagnosis not present

## 2017-06-08 DIAGNOSIS — Z809 Family history of malignant neoplasm, unspecified: Secondary | ICD-10-CM | POA: Diagnosis not present

## 2017-06-08 DIAGNOSIS — R911 Solitary pulmonary nodule: Secondary | ICD-10-CM | POA: Diagnosis not present

## 2017-06-08 DIAGNOSIS — Z885 Allergy status to narcotic agent status: Secondary | ICD-10-CM | POA: Diagnosis not present

## 2017-06-08 LAB — BASIC METABOLIC PANEL
Anion gap: 5 (ref 5–15)
BUN: 8 mg/dL (ref 6–20)
CALCIUM: 8.7 mg/dL — AB (ref 8.9–10.3)
CO2: 24 mmol/L (ref 22–32)
CREATININE: 0.57 mg/dL (ref 0.44–1.00)
Chloride: 107 mmol/L (ref 101–111)
GFR calc non Af Amer: >60 mL/min (ref >60)
GLUCOSE: 141 mg/dL — AB (ref 65–99)
Potassium: 4 mmol/L (ref 3.5–5.1)
Sodium: 136 mmol/L (ref 135–145)

## 2017-06-08 LAB — CBC
HEMATOCRIT: 30.3 % — AB (ref 36.0–46.0)
Hemoglobin: 9.8 g/dL — ABNORMAL LOW (ref 12.0–15.0)
MCH: 25.9 pg — ABNORMAL LOW (ref 26.0–34.0)
MCHC: 32.3 g/dL (ref 30.0–36.0)
MCV: 80.2 fL (ref 78.0–100.0)
Platelets: 397 10*3/uL (ref 150–400)
RBC: 3.78 MIL/uL — ABNORMAL LOW (ref 3.87–5.11)
RDW: 19.7 % — AB (ref 11.5–15.5)
WBC: 9.7 10*3/uL (ref 4.0–10.5)

## 2017-06-08 MED ORDER — DOCUSATE SODIUM 100 MG PO CAPS: 100.0000 mg | ORAL_CAPSULE | Freq: Two times a day (BID) | ORAL | 0 refills | Status: DC

## 2017-06-08 MED ORDER — OXYCODONE-ACETAMINOPHEN 5-325 MG PO TABS: 1.0000 | ORAL_TABLET | ORAL | 0 refills | Status: DC | PRN

## 2017-06-08 MED ORDER — IBUPROFEN 800 MG PO TABS: 800.0000 mg | ORAL_TABLET | Freq: Three times a day (TID) | ORAL | 1 refills | Status: AC | PRN

## 2017-06-08 NOTE — Discharge Instructions (Signed)

## 2017-06-08 NOTE — Discharge Summary (Signed)
Physician Discharge Summary  Patient ID: Crystal Spencer MRN: [id number] DOB/AGE: 07/18/1976 41 y.o.  Admit date: 06/07/2017 Discharge date: 06/08/2017  Admission Diagnoses: AUB, fibroid uterus; h/o DVT on OCPs  Discharge Diagnoses:  AUB, fibroid uterus; h/o DVT on OCPs  Discharged Condition: good  Hospital Course: Patient had an uncomplicated surgery; for further details of this surgery, please refer to the operative note. Furthermore, the patient had an uncomplicated postoperative course.  By time of discharge, her pain was controlled on oral pain medications; she was ambulating, voiding without difficulty, tolerating regular diet and passing flatus.  She was deemed stable for discharge to home.    Consults: None  Significant Diagnostic Studies: labs: CBC, BMP  Treatments: surgery: Robot assisted total lap hyst with bilateral salpingectomy  Discharge Exam: Blood pressure (!) 79/43, pulse (!) 56, temperature 98.9 F (37.2 C), temperature source Oral, resp. rate 18, height 5\' 4"  (1.626 m), weight 157 lb (71.2 kg), last menstrual period 05/16/2017, SpO2 96 %.  I/O last 3 completed shifts: In: 3757.9 [P.O.:360; I.V.:3397.9] Out: 1900 [Urine:1800; Blood:100] Total I/O In: -  Out: 100 [Urine:100] General appearance: alert, cooperative and no distress Resp: clear to auscultation bilaterally Cardio: regular rate and rhythm, S1, S2 normal, no murmur, click, rub or gallop GI: soft, non-tender; bowel sounds normal; no masses,  no organomegaly Extremities: extremities normal, atraumatic, no cyanosis or edema Incision/Wound: all pos sites clean and dry.   CBC Latest Ref Rng & Units 06/08/2017 06/02/2017 12/31/2016  WBC 4.0 - 10.5 K/uL 9.7 5.5 7.8  Hemoglobin 12.0 - 15.0 g/dL 9.8(L) 11.9(L) 11.2(L)  Hematocrit 36.0 - 46.0 % 30.3(L) 37.1 34.5(L)  Platelets 150 - 400 K/uL 397 562(H) 367   BMP Latest Ref Rng & Units 06/08/2017 12/31/2016 12/29/2016  Glucose 65 - 99 mg/dL 141(H) 96 95  BUN 6 - 20  mg/dL 8 6 6   Creatinine 0.44 - 1.00 mg/dL 0.57 0.64 0.62  Sodium 135 - 145 mmol/L 136 135 138  Potassium 3.5 - 5.1 mmol/L 4.0 4.4 3.9  Chloride 101 - 111 mmol/L 107 105 108  CO2 22 - 32 mmol/L 24 22 23   Calcium 8.9 - 10.3 mg/dL 8.7(L) 9.0 9.2     Disposition: 01-Home or Self Care  Discharge Instructions    Call MD for:  difficulty breathing, headache or visual disturbances    Complete by:  As directed    Call MD for:  extreme fatigue    Complete by:  As directed    Call MD for:  hives    Complete by:  As directed    Call MD for:  persistant dizziness or light-headedness    Complete by:  As directed    Call MD for:  persistant nausea and vomiting    Complete by:  As directed    Call MD for:  redness, tenderness, or signs of infection (pain, swelling, redness, odor or green/yellow discharge around incision site)    Complete by:  As directed    Call MD for:  severe uncontrolled pain    Complete by:  As directed    Call MD for:  temperature >100.4    Complete by:  As directed    Diet general    Complete by:  As directed    Discharge wound care:    Complete by:  As directed    Please remove  dressing from midline dressing in 1 week   Driving Restrictions    Complete by:  As directed    No  driving for 2 weeks   Increase activity slowly    Complete by:  As directed    Lifting restrictions    Complete by:  As directed    No heavy lifting for 4 weeks   Sexual Activity Restrictions    Complete by:  As directed    NO sexual intercourse for eight (8) weeks. NOTHING in the vagina for 8 weeks     Allergies as of 06/08/2017      Reactions   Estrogens Other (See Comments)   DVT and PE on OCP's   Hydrocodone Nausea And Vomiting   Amoxicillin Rash   Has patient had a PCN reaction causing immediate rash, facial/tongue/throat swelling, SOB or lightheadedness with hypotension: Unknown Has patient had a PCN reaction causing severe rash involving mucus membranes or skin necrosis:  No Has patient had a PCN reaction that required hospitalization: No Has patient had a PCN reaction occurring within the last 10 years: Yes If all of the above answers are "NO", then may proceed with Cephalosporin use.      Medication List    STOP taking these medications   enoxaparin 80 MG/0.8ML injection Commonly known as:  LOVENOX   guaiFENesin 600 MG 12 hr tablet Commonly known as:  MUCINEX     TAKE these medications   docusate sodium 100 MG capsule Commonly known as:  COLACE Take 1 capsule (100 mg total) by mouth 2 (two) times daily.   fluticasone 50 MCG/ACT nasal spray Commonly known as:  FLONASE Place 2 sprays into both nostrils daily.   ibuprofen 800 MG tablet Commonly known as:  ADVIL,MOTRIN Take 1 tablet (800 mg total) by mouth 3 (three) times daily with meals as needed for headache or moderate pain. What changed:  medication strength  how much to take  when to take this  reasons to take this   INTEGRA 62.5-62.5-40-3 MG Caps Take 1 capsule by mouth daily.   oxyCODONE-acetaminophen 5-325 MG tablet Commonly known as:  PERCOCET/ROXICET Take 1-2 tablets by mouth every 4 (four) hours as needed (moderate to severe pain (when tolerating fluids)).      Follow-up Information    Lavonia Drafts, MD Follow up in 2 week(s).   Specialty:  Obstetrics and Gynecology Contact information: [place] [address] [phone]           Signed: Lavonia Drafts 06/08/2017, 10:41 AM

## 2017-06-08 NOTE — Progress Notes (Signed)
Pt discharged to home with boyfriend.  Condition stable.  Pt ambulated to car with RN.  No equipment for home ordered at discharge.

## 2017-06-09 ENCOUNTER — Encounter (HOSPITAL_COMMUNITY): Payer: Self-pay | Admitting: Obstetrics & Gynecology

## 2017-06-10 NOTE — Anesthesia Postprocedure Evaluation (Signed)
Anesthesia Post Note  Patient: Riley Papin  Procedure(s) Performed: Procedure(s) (LRB): ROBOTIC ASSISTED TOTAL HYSTERECTOMY WITH SALPINGECTOMY (Bilateral) CYSTOSCOPY (N/A)     Patient location during evaluation: PACU Anesthesia Type: General Level of consciousness: awake and alert Pain management: pain level controlled Vital Signs Assessment: post-procedure vital signs reviewed and stable Respiratory status: spontaneous breathing, nonlabored ventilation, respiratory function stable and patient connected to nasal cannula oxygen Cardiovascular status: blood pressure returned to baseline and stable Postop Assessment: no signs of nausea or vomiting Anesthetic complications: no    Last Vitals:  Vitals:   06/08/17 0800 06/08/17 1200  BP: (!) 79/43 (!) 86/45  Pulse: (!) 56 (!) 59  Resp: 18 18  Temp: 37.2 C 37.2 C    Last Pain:  Vitals:   06/08/17 1200  TempSrc: Oral  PainSc:                  Riccardo Dubin

## 2017-06-23 ENCOUNTER — Telehealth: Payer: Self-pay | Admitting: General Practice

## 2017-06-23 DIAGNOSIS — T8140XA Infection following a procedure, unspecified, initial encounter: Secondary | ICD-10-CM

## 2017-06-23 MED ORDER — METRONIDAZOLE 500 MG PO TABS: 500.0000 mg | ORAL_TABLET | Freq: Two times a day (BID) | ORAL | 0 refills | Status: AC

## 2017-06-23 MED ORDER — AZITHROMYCIN 250 MG PO TABS: 250.0000 mg | ORAL_TABLET | Freq: Every day | ORAL | 0 refills | Status: AC

## 2017-06-23 NOTE — Telephone Encounter (Signed)
Patient called into front office stating she was initially feeling a lot better after her surgery but starting Monday & Tuesday she started to have crampy/achy feeling in her lower abdomen/pelvis that radiates to her back. Patient states she is also having a yellow discharge. Patient states the pain has worsened throughout the week and last night she woke up in hot, sweating profusely all over. Patient also reports feeling feverish/chills but has not taken her temperature. Called Dr Ihor Dow who recommends patient come in tomorrow to [facility] office at 8:15am and patient is to begin flagyl 500mg  BID x 14 days as well as zithromax 250mg  x 10 days. Informed patient of prescriptions, appt time & [facility] office location. Also told patient if she develops fever or chills overnight to go to [facility] immediately and do not wait for office appt. Patient verbalized understanding to all & had no questions

## 2017-06-23 NOTE — Telephone Encounter (Signed)
Per Dr Ihor Dow, patient should take 2 tabs of zithromax and 1 tab of flagyl upon receiving meds and 1 tab of flagyl later this evening. Called and informed patient. Patient verbalized understanding and had no questions

## 2017-06-24 ENCOUNTER — Encounter: Payer: Self-pay | Admitting: Obstetrics & Gynecology

## 2017-06-24 ENCOUNTER — Ambulatory Visit (INDEPENDENT_AMBULATORY_CARE_PROVIDER_SITE_OTHER): Payer: BLUE CROSS/BLUE SHIELD | Admitting: Obstetrics & Gynecology

## 2017-06-24 ENCOUNTER — Ambulatory Visit (HOSPITAL_BASED_OUTPATIENT_CLINIC_OR_DEPARTMENT_OTHER)
Admission: RE | Admit: 2017-06-24 | Discharge: 2017-06-24 | Disposition: A | Discharge status: Home / Self Care | Payer: BLUE CROSS/BLUE SHIELD | Source: Ambulatory Visit | Attending: Obstetrics & Gynecology | Admitting: Obstetrics & Gynecology

## 2017-06-24 VITALS — BP 103/58 | Temp 98.3°F | Ht 64.0 in | Wt 157.0 lb

## 2017-06-24 DIAGNOSIS — E282 Polycystic ovarian syndrome: Secondary | ICD-10-CM | POA: Diagnosis not present

## 2017-06-24 DIAGNOSIS — Z90710 Acquired absence of both cervix and uterus: Secondary | ICD-10-CM | POA: Insufficient documentation

## 2017-06-24 DIAGNOSIS — N760 Acute vaginitis: Secondary | ICD-10-CM | POA: Diagnosis not present

## 2017-06-24 DIAGNOSIS — N83202 Unspecified ovarian cyst, left side: Secondary | ICD-10-CM | POA: Diagnosis not present

## 2017-06-24 DIAGNOSIS — N83201 Unspecified ovarian cyst, right side: Secondary | ICD-10-CM | POA: Diagnosis not present

## 2017-06-24 NOTE — Progress Notes (Signed)
History:  41 y.o. G0P0000 here today for eval of post op complications. She reports pain and discharge for 1 week. She reports that she felt chills 2 days ago but, did not check her temp.  She reports vacuuming and having oral intercourse 5 days prev.    She also reports that she has been washing clothes.  She is not requiring pain meds. She denies bleeding. Pt had further questions re anal intercourse.   The following portions of the patient's history were reviewed and updated as appropriate: allergies, current medications, past family history, past medical history, past social history, past surgical history and problem list.  Review of Systems:  Pertinent items are noted in HPI.   Objective:  Physical Exam Blood pressure (!) 103/58, temperature 98.3 F (36.8 C), temperature source Oral, height 5\' 4"  (1.626 m), weight 157 lb (71.2 kg). CONSTITUTIONAL: Well-developed, well-nourished female in no acute distress.  HENT:  Normocephalic, atraumatic EYES: Conjunctivae and EOM are normal. No scleral icterus.  NECK: Normal range of motion SKIN: Skin is warm and dry. No rash noted. Not diaphoretic.No pallor. [place]: Alert and oriented to person, place, and time. Normal coordination.  Abd: Soft, nontender and nondistended; all ports sites clean, dry and intact and healing well.  Pelvic: Normal appearing external genitalia; normal appearing vaginal mucosa. There is a suture noted at the cuff. There is a mod amount of discharge that is NOT malodorous or purulent.  There is no adnexal tenderness    Assessment & Plan:  Pelvic pain and discharge- suspect vaginal cuff cellulitis.  Atbx started over the phone yesterday. Flagyl and Azithromycin.  Reviewed post op instructions with pt. Keep atbx F/u as scheduled on 7/24. F.u sooner prn Pelvic US today Reminded pt that the recommendation is for NO intercourse for 8 weeks post op   Arvie Villarruel L. Harraway-Smith, M.D., Cherlynn June

## 2017-06-24 NOTE — Patient Instructions (Signed)

## 2017-06-28 ENCOUNTER — Encounter: Payer: Self-pay | Admitting: Obstetrics & Gynecology

## 2017-06-28 ENCOUNTER — Ambulatory Visit (INDEPENDENT_AMBULATORY_CARE_PROVIDER_SITE_OTHER): Payer: BLUE CROSS/BLUE SHIELD | Admitting: Obstetrics & Gynecology

## 2017-06-28 VITALS — BP 106/72 | HR 97 | Wt 156.6 lb

## 2017-06-28 DIAGNOSIS — Z9889 Other specified postprocedural states: Secondary | ICD-10-CM

## 2017-06-28 NOTE — Progress Notes (Signed)
History:  41 y.o. G0P0000 here today for f/u of a cuff cellulitis. Pt reports improvement of her discharge.   She has not been lifting and feels like she has been doing better with following the instructions. Pt denies pain or fever and chills. Pt reports that she had some spotting after the [place]. She reports that she took the Azithromycin incorrectly. She has been taking it 2x/day.    The following portions of the patient's history were reviewed and updated as appropriate: allergies, current medications, past family history, past medical history, past social history, past surgical history and problem list.  Review of Systems:  Pertinent items are noted in HPI.   Objective:  Physical Exam Blood pressure 106/72, pulse 97, weight 156 lb 9.6 oz (71 kg). CONSTITUTIONAL: Well-developed, well-nourished female in no acute distress.  HENT:  Normocephalic, atraumatic EYES: Conjunctivae and EOM are normal. No scleral icterus.  NECK: Normal range of motion SKIN: Skin is warm and dry. No rash noted. Not diaphoretic.No pallor. [place]: Alert and oriented to person, place, and time. Normal coordination.  Abd: Soft, nontender and nondistended; port sites all healing well Pelvic:deferred  Labs and Imaging US Transvaginal Non-ob  Result Date: 06/24/2017 CLINICAL DATA:  Right lower quadrant pain. Hysterectomy. History of PCOS an uterine fibroids . EXAM: TRANSABDOMINAL AND TRANSVAGINAL ULTRASOUND OF PELVIS TECHNIQUE: Both transabdominal and transvaginal ultrasound examinations of the pelvis were performed. Transabdominal technique was performed for global imaging of the pelvis including uterus, ovaries, adnexal regions, and pelvic cul-de-sac. It was necessary to proceed with endovaginal exam following the transabdominal exam to visualize the ovaries. COMPARISON:  Ultrasound 05/21/2016 . FINDINGS: Uterus Hysterectomy. Echogenic shadowing density none the cervix consistent with dystrophic calcification. Right  ovary Measurements: 4.4 x 2.7 x 3.47. Multiple cysts again noted consistent patient's history of PCOS. Left ovary Measurements: 3.9 x 2.2 x 3.6 cm. Multiple cyst noted consistent patient's history of PCOS. Other findings Small amount of free pelvic fluid. IMPRESSION: 1. Multiple small bilateral ovarian cyst consistent patient's history of PCOS. No significant adnexal mass. 2.  Prior hysterectomy. 3.  Amount of free pelvic fluid . Electronically Signed   By: Marcello Moores  Register   On: 06/24/2017 12:17   US Pelvis Complete  Result Date: 06/24/2017 CLINICAL DATA:  Right lower quadrant pain. Hysterectomy. History of PCOS an uterine fibroids . EXAM: TRANSABDOMINAL AND TRANSVAGINAL ULTRASOUND OF PELVIS TECHNIQUE: Both transabdominal and transvaginal ultrasound examinations of the pelvis were performed. Transabdominal technique was performed for global imaging of the pelvis including uterus, ovaries, adnexal regions, and pelvic cul-de-sac. It was necessary to proceed with endovaginal exam following the transabdominal exam to visualize the ovaries. COMPARISON:  Ultrasound 05/21/2016 . FINDINGS: Uterus Hysterectomy. Echogenic shadowing density none the cervix consistent with dystrophic calcification. Right ovary Measurements: 4.4 x 2.7 x 3.47. Multiple cysts again noted consistent patient's history of PCOS. Left ovary Measurements: 3.9 x 2.2 x 3.6 cm. Multiple cyst noted consistent patient's history of PCOS. Other findings Small amount of free pelvic fluid. IMPRESSION: 1. Multiple small bilateral ovarian cyst consistent patient's history of PCOS. No significant adnexal mass. 2.  Prior hysterectomy. 3.  Amount of free pelvic fluid . Electronically Signed   By: Marcello Moores  Register   On: 06/24/2017 12:17    Assessment & Plan:  2 week post op check after [place] with bilateral salpingectomy Reviewed Post op instructions.  May RTW on 8/31 with light duty and no lifting.  Gradual return to full activity.  Complete Flagyl and  Azithmycin NO intercourse.  Parminder Cupples L. Harraway-Smith, M.D., Cherlynn June

## 2017-06-28 NOTE — Patient Instructions (Signed)

## 2017-07-15 IMAGING — CT CT ANGIO CHEST
2 of 8 series · 18 of 46 positions shown · IV contrast (OMNI)
Comparison: None.

CLINICAL DATA: Shortness of breath and cough. Right-sided chest
pain.

EXAM:
CT ANGIOGRAPHY CHEST WITH CONTRAST
TECHNIQUE: Multidetector CT imaging of the chest was performed using the
standard protocol during bolus administration of intravenous
contrast. Multiplanar CT image reconstructions and MIPs were
obtained to evaluate the vascular anatomy.
CONTRAST:  100 cc Isovue 370

[Series 5: thins · axial · 0.60mm/px · z∈[-51,+206]mm · 15 of 283 slices shown]
[im 13/283  lung]
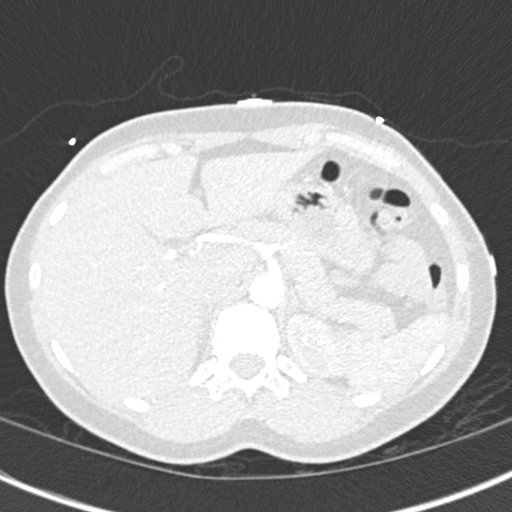
[im 39/283  soft-tissue]
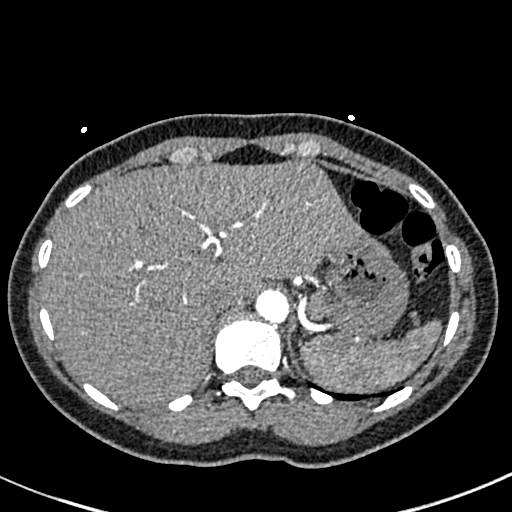
[im 52/283  lung]
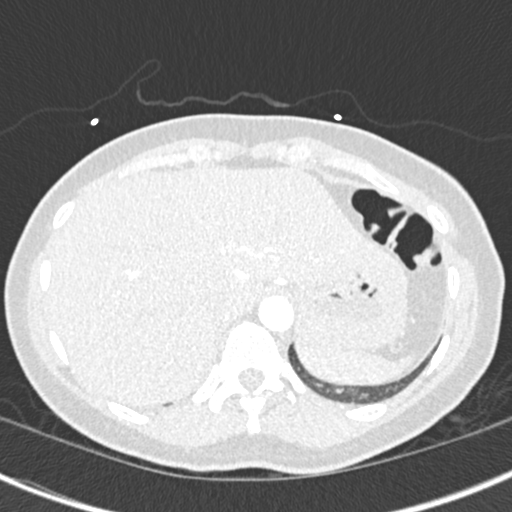
[im 65/283  soft-tissue]
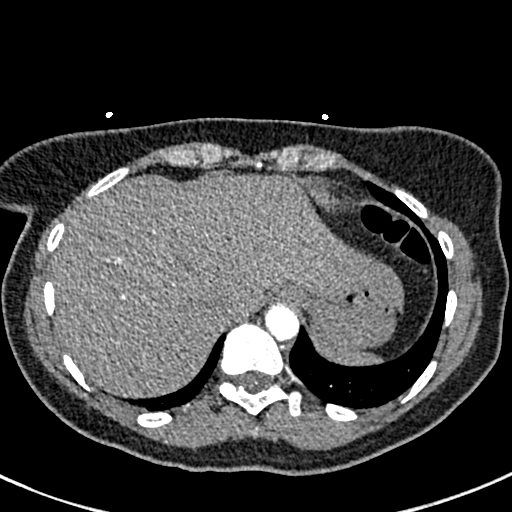
[im 90/283  lung]
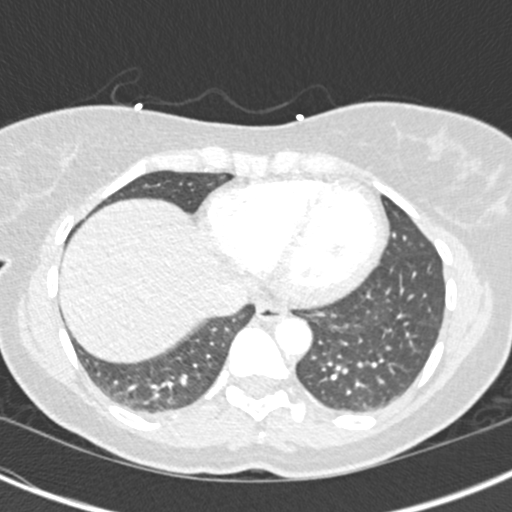
[im 103/283  soft-tissue]
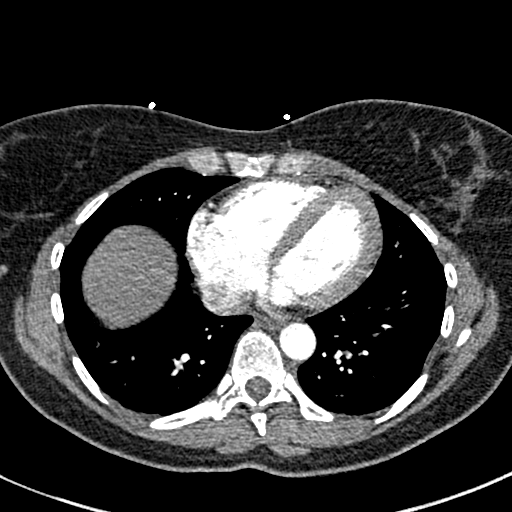
[im 129/283  lung]
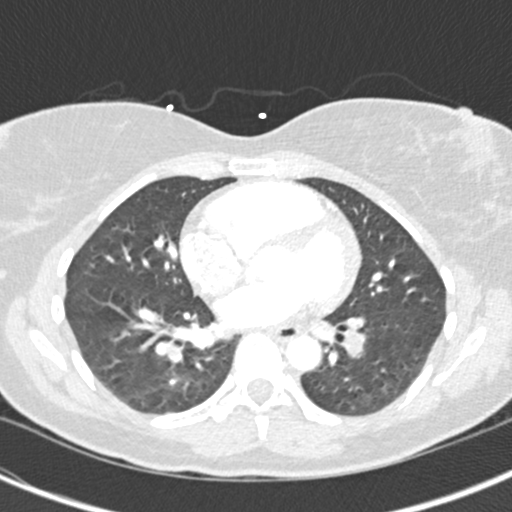
[im 142/283  soft-tissue]
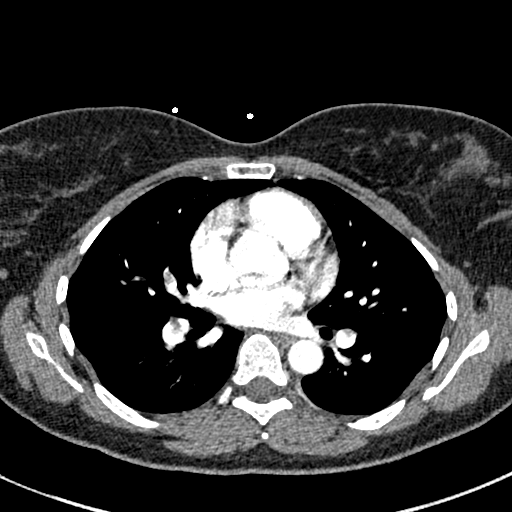
[im 154/283  lung]
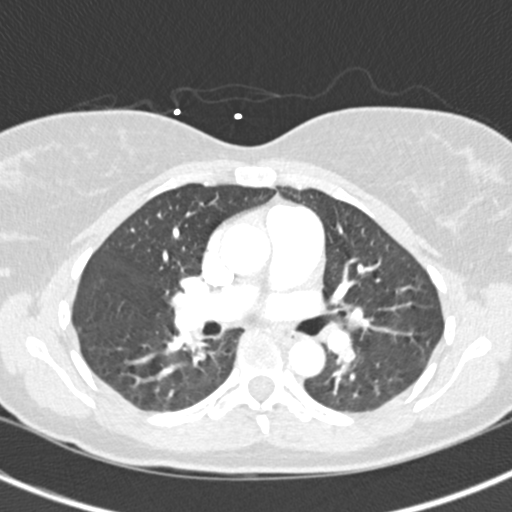
[im 180/283  soft-tissue]
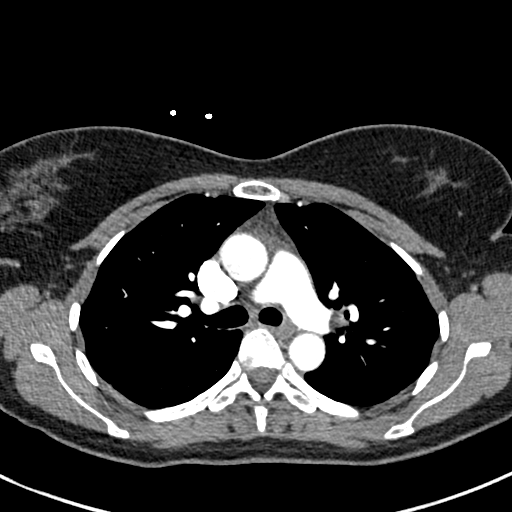
[im 193/283  lung]
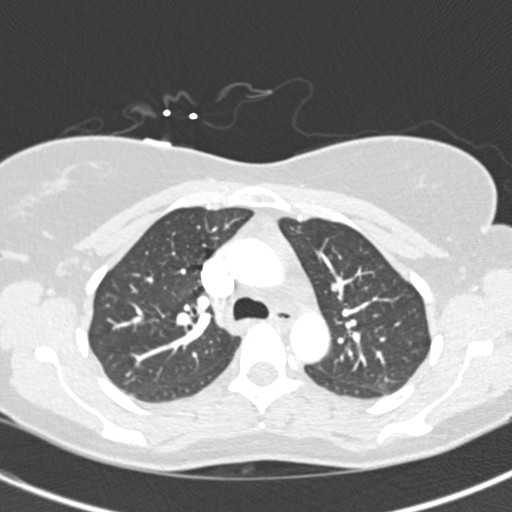
[im 218/283  soft-tissue]
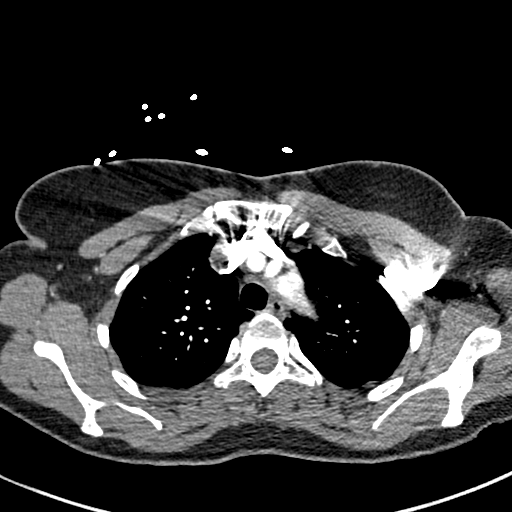
[im 231/283  lung]
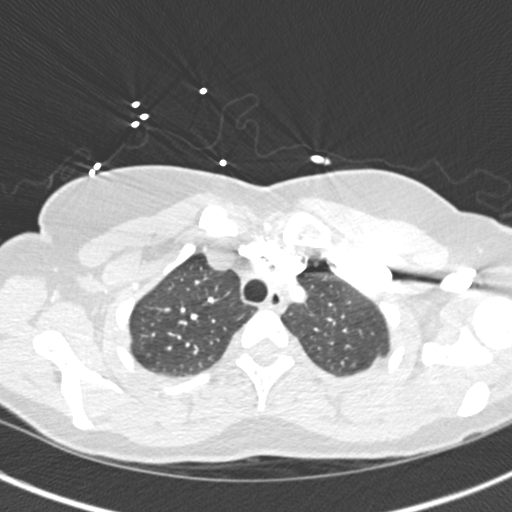
[im 244/283  soft-tissue]
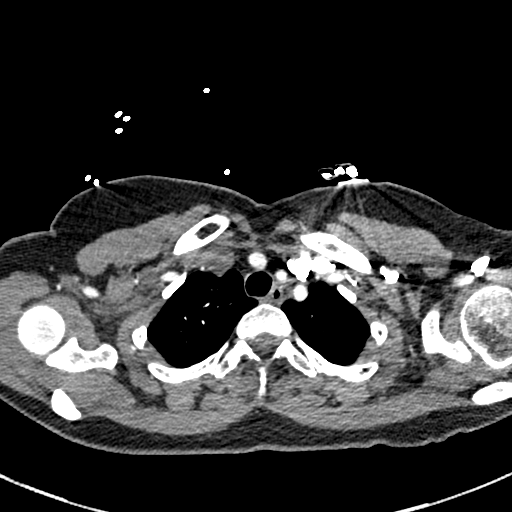
[im 270/283  lung]
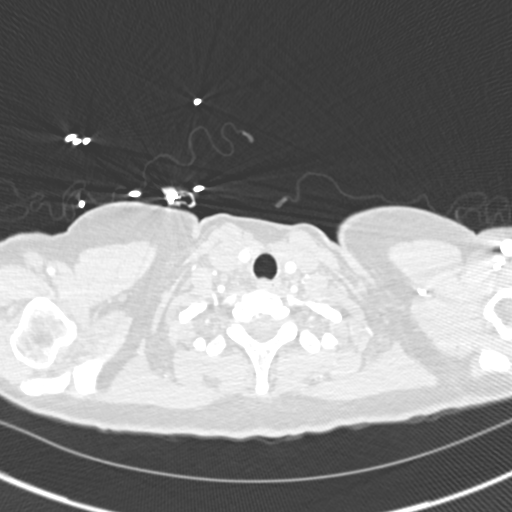

[Series 7: coronal mpr · coronal · 0.56mm/px · 3 of 120 slices shown]
[im 30/120  soft-tissue]
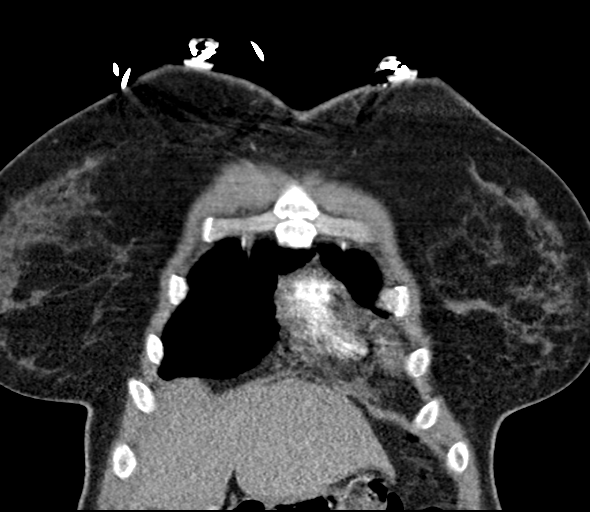
[im 60/120  soft-tissue]
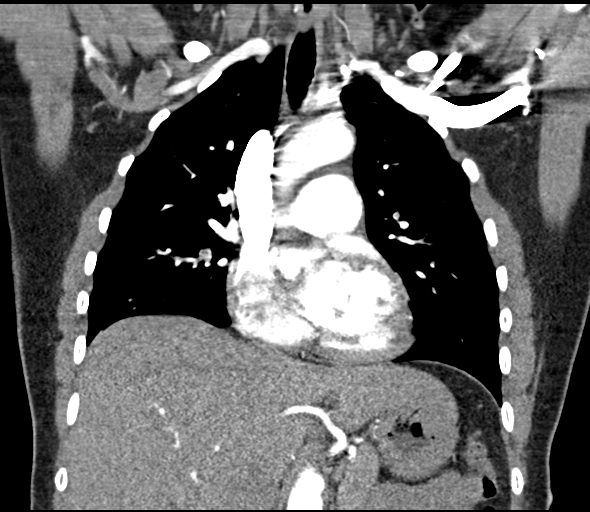
[im 90/120  soft-tissue]
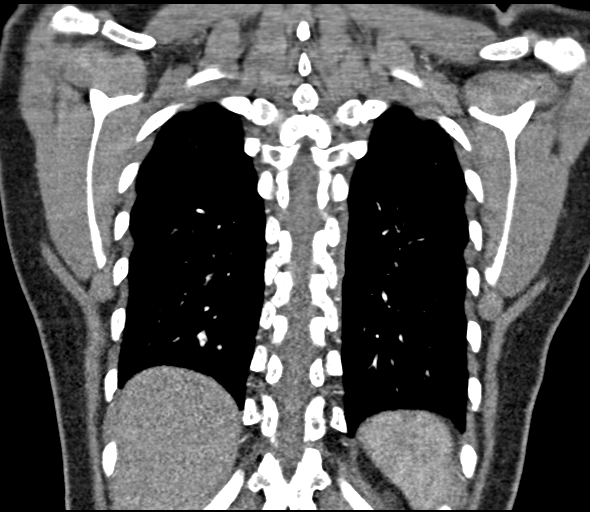

[18 of 46 positions shown; findings below may reference images not displayed]

FINDINGS: Cardiovascular: The heart size is normal. No pericardial effusion.
No thoracic aortic aneurysm. Lobar pulmonary artery is seen in the
lungs bilaterally with segmental and subsegmental pulmonary embolus
also noted bilaterally in most prominent in the lower lobes of each
lung. RV/LV ratio is 0.95.

Mediastinum/Nodes: No mediastinal lymphadenopathy. There is no hilar
lymphadenopathy. The esophagus has normal imaging features. There is
no axillary lymphadenopathy.

Lungs/Pleura: 4 mm left lower lobe pulmonary nodule seen image 76
series 6. No focal airspace consolidation. No evidence for pulmonary
infarct. No pulmonary edema or pleural effusion.

Upper Abdomen: 8 mm hypervascular focus in the posterior right
liver, likely benign.

Musculoskeletal: Bone windows reveal no worrisome lytic or sclerotic
osseous lesions.

Review of the MIP images confirms the above findings.
IMPRESSION: 1. Acute pulmonary embolus involving lobar, segmental and
subsegmental pulmonary arteries bilaterally. Positive for acute PE
with CT evidence of right heart strain (RV/LV Ratio = 0.95)
consistent with at least submassive (intermediate risk) PE. The
presence of right heart strain has been associated with an increased
risk of morbidity and mortality. Please activate Code PE by paging
[phone].
2. 4 mm left lower lobe pulmonary nodule. No follow-up needed if
patient is low-risk. Non-contrast chest CT can be considered in 12
months if patient is high-risk. This recommendation follows the
consensus statement: Guidelines for Management of Incidental
Pulmonary Nodules Detected on CT Images: From the [HOSPITAL]
Critical Value/emergent results were called by telephone at the time
of interpretation on 12/29/2016 at [DATE] to Dr. FARHANA REISER ,
who verbally acknowledged these results.

## 2017-07-19 ENCOUNTER — Encounter: Payer: Self-pay | Admitting: Obstetrics & Gynecology

## 2017-07-19 ENCOUNTER — Ambulatory Visit (INDEPENDENT_AMBULATORY_CARE_PROVIDER_SITE_OTHER): Payer: BLUE CROSS/BLUE SHIELD | Admitting: Obstetrics & Gynecology

## 2017-07-19 VITALS — BP 114/65 | HR 75 | Ht 64.0 in | Wt 159.4 lb

## 2017-07-19 DIAGNOSIS — Z9889 Other specified postprocedural states: Secondary | ICD-10-CM

## 2017-07-19 NOTE — Progress Notes (Signed)
History:  41 y.o. G0P0000 here today for 6 week post op check. Pt reports that occ she does too much and feels sore but, otherwise she is back to work with no problems. She reports no problems voiding or passing her stools.   The following portions of the patient's history were reviewed and updated as appropriate: allergies, current medications, past family history, past medical history, past social history, past surgical history and problem list.  Review of Systems:  Pertinent items are noted in HPI.   Objective:  Physical Exam Blood pressure 114/65, pulse 75, height 5\' 4"  (1.626 m), weight 159 lb 6.4 oz (72.3 kg).  CONSTITUTIONAL: Well-developed, well-nourished female in no acute distress.  HENT:  Normocephalic, atraumatic EYES: Conjunctivae and EOM are normal. No scleral icterus.  NECK: Normal range of motion SKIN: Skin is warm and dry. No rash noted. Not diaphoretic.No pallor. [place]: Alert and oriented to person, place, and time. Normal coordination.   Abd: Soft, nontender and nondistended; port site well healed. Pelvic: Normal appearing external genitalia; Vaginal cuff well healed. Normal discharge.  No palpable masses or adnexal tenderness  Labs and Imaging 06/07/2017 Diagnosis Uterus and cervix, Bilateral Tubes - CERVIX: UNREMARKABLE. - ENDOMETRIUM: BENIGN SECRETORY-TYPE ENDOMETRIUM. - MYOMETRIUM: LEIOMYOMATA. - SEROSA: UNREMARKABLE. - BILATERAL FALLOPIAN TUBES: UNREMARKABLE.  Assessment & Plan:  6 week post op check- doing well No intercourse for another 2 weeks F/u in 3 months or sooner prn All questions answered  Hero Mccathern L. Harraway-Smith, M.D., Cherlynn June

## 2017-07-19 NOTE — Patient Instructions (Signed)

## 2017-07-25 ENCOUNTER — Inpatient Hospital Stay (HOSPITAL_COMMUNITY): Payer: BLUE CROSS/BLUE SHIELD

## 2017-07-25 ENCOUNTER — Observation Stay (HOSPITAL_COMMUNITY)
Admission: AD | Admit: 2017-07-25 | Discharge: 2017-07-26 | Disposition: A | Discharge status: Home / Self Care | Payer: BLUE CROSS/BLUE SHIELD | Source: Ambulatory Visit | Attending: Obstetrics and Gynecology | Admitting: Obstetrics and Gynecology

## 2017-07-25 ENCOUNTER — Encounter (HOSPITAL_COMMUNITY): Payer: Self-pay | Admitting: *Deleted

## 2017-07-25 ENCOUNTER — Encounter (HOSPITAL_COMMUNITY)
Admission: AD | Disposition: A | Discharge status: Home / Self Care | Payer: Self-pay | Source: Ambulatory Visit | Attending: Obstetrics and Gynecology

## 2017-07-25 DIAGNOSIS — T8131XA Disruption of external operation (surgical) wound, not elsewhere classified, initial encounter: Secondary | ICD-10-CM

## 2017-07-25 DIAGNOSIS — R102 Pelvic and perineal pain: Secondary | ICD-10-CM | POA: Diagnosis not present

## 2017-07-25 DIAGNOSIS — T81328A Disruption or dehiscence of closure of other specified internal operation (surgical) wound, initial encounter: Secondary | ICD-10-CM | POA: Diagnosis present

## 2017-07-25 DIAGNOSIS — Y838 Other surgical procedures as the cause of abnormal reaction of the patient, or of later complication, without mention of misadventure at the time of the procedure: Secondary | ICD-10-CM | POA: Insufficient documentation

## 2017-07-25 DIAGNOSIS — Z9889 Other specified postprocedural states: Secondary | ICD-10-CM

## 2017-07-25 DIAGNOSIS — Z86711 Personal history of pulmonary embolism: Secondary | ICD-10-CM | POA: Diagnosis not present

## 2017-07-25 DIAGNOSIS — T8132XA Disruption of internal operation (surgical) wound, not elsewhere classified, initial encounter: Secondary | ICD-10-CM | POA: Diagnosis not present

## 2017-07-25 DIAGNOSIS — R109 Unspecified abdominal pain: Secondary | ICD-10-CM | POA: Diagnosis not present

## 2017-07-25 DIAGNOSIS — Z90710 Acquired absence of both cervix and uterus: Secondary | ICD-10-CM | POA: Diagnosis not present

## 2017-07-25 LAB — COMPREHENSIVE METABOLIC PANEL
ALK PHOS: 52 U/L (ref 38–126)
ALT: 17 U/L (ref 14–54)
AST: 19 U/L (ref 15–41)
Albumin: 3.8 g/dL (ref 3.5–5.0)
Anion gap: 10 (ref 5–15)
BUN: 8 mg/dL (ref 6–20)
CHLORIDE: 104 mmol/L (ref 101–111)
CO2: 23 mmol/L (ref 22–32)
Calcium: 9 mg/dL (ref 8.9–10.3)
Creatinine, Ser: 0.61 mg/dL (ref 0.44–1.00)
Glucose, Bld: 95 mg/dL (ref 65–99)
POTASSIUM: 3.7 mmol/L (ref 3.5–5.1)
Sodium: 137 mmol/L (ref 135–145)
Total Bilirubin: 0.4 mg/dL (ref 0.3–1.2)
Total Protein: 6.9 g/dL (ref 6.5–8.1)

## 2017-07-25 LAB — CBC
HEMATOCRIT: 35.4 % — AB (ref 36.0–46.0)
HEMOGLOBIN: 11.7 g/dL — AB (ref 12.0–15.0)
MCH: 26.9 pg (ref 26.0–34.0)
MCHC: 33.1 g/dL (ref 30.0–36.0)
MCV: 81.4 fL (ref 78.0–100.0)
Platelets: 447 10*3/uL — ABNORMAL HIGH (ref 150–400)
RBC: 4.35 MIL/uL (ref 3.87–5.11)
RDW: 17.6 % — ABNORMAL HIGH (ref 11.5–15.5)
WBC: 14.7 10*3/uL — AB (ref 4.0–10.5)

## 2017-07-25 LAB — TYPE AND SCREEN
ABO/RH(D): A POS
Antibody Screen: NEGATIVE

## 2017-07-25 SURGERY — REPAIR, VAGINAL CUFF
Anesthesia: Choice

## 2017-07-25 MED ORDER — IOPAMIDOL (ISOVUE-300) INJECTION 61%
30.0000 mL | Freq: Once | INTRAVENOUS | Status: AC | PRN
  Administered 2017-07-25: 30 mL via ORAL

## 2017-07-25 MED ORDER — IOPAMIDOL (ISOVUE-300) INJECTION 61%
100.0000 mL | Freq: Once | INTRAVENOUS | Status: AC | PRN
Start: 2017-07-25 — End: 2017-07-25
  Administered 2017-07-25: 100 mL via INTRAVENOUS

## 2017-07-25 MED ORDER — SOD CITRATE-CITRIC ACID 500-334 MG/5ML PO SOLN
30.0000 mL | ORAL | Status: AC
  Administered 2017-07-26: 30 mL via ORAL
  Filled 2017-07-25: qty 15

## 2017-07-25 NOTE — H&P (Addendum)
Crystal Spencer is an 41 y.o. female who had a [place] with BS on July 3. Presents to MAU with sudden onset of severe pelvic and abd pain after intercourse this afternoon. She has noted some spotting as well. No N/V, fever or chills.  Pt's post op course was complicated by cuff cellulitis, which was treated on July 20.  She was seen for here routine post op visit on 8/14 and noted to be doing well but had been advised to reframe from [place] for another 2 weeks.     Past Medical History:  Diagnosis Date  . Allergy   . Anemia    borderline  . Ankle pain, right   . Anxiety   . GERD (gastroesophageal reflux disease)   . Headache    Migraines  . Low blood pressure   . Lung nodule   . Numbness and tingling of both feet   . PE (pulmonary thromboembolism) ([place]) 12/29/2016  . Polycystic ovarian syndrome   . Uterine fibroid     Past Surgical History:  Procedure Laterality Date  . APPENDECTOMY    . CYSTOSCOPY N/A 06/07/2017   Procedure: CYSTOSCOPY;  Surgeon: Lavonia Drafts, MD;  Location: [facility];  Service: Gynecology;  Laterality: N/A;  . EYE SURGERY    . ROBOTIC ASSISTED TOTAL HYSTERECTOMY WITH SALPINGECTOMY Bilateral 06/07/2017   Procedure: ROBOTIC ASSISTED TOTAL HYSTERECTOMY WITH SALPINGECTOMY;  Surgeon: Lavonia Drafts, MD;  Location: [facility];  Service: Gynecology;  Laterality: Bilateral;  . TONSILLECTOMY      Family History  Problem Relation Age of Onset  . Hyperlipidemia Brother   . Cancer Maternal Grandmother   . Diabetes Maternal Grandmother   . Hearing loss Maternal Grandmother   . Heart disease Maternal Grandfather   . Stroke Maternal Grandfather   . Diabetes Paternal Grandmother   . Stroke Paternal Grandfather   . Mental retardation Paternal Grandfather     Social History:  reports that she has never smoked. She has never used smokeless tobacco. She reports that she drinks alcohol. She reports that she does not use drugs.  Allergies:  Allergies  Allergen  Reactions  . Estrogens Other (See Comments)    DVT and PE on OCP's  . Hydrocodone Nausea And Vomiting  . Amoxicillin Rash    Has patient had a PCN reaction causing immediate rash, facial/tongue/throat swelling, SOB or lightheadedness with hypotension: Unknown Has patient had a PCN reaction causing severe rash involving mucus membranes or skin necrosis: No Has patient had a PCN reaction that required hospitalization: No Has patient had a PCN reaction occurring within the last 10 years: Yes If all of the above answers are "NO", then may proceed with Cephalosporin use.     Prescriptions Prior to Admission  Medication Sig Dispense Refill Last Dose  . docusate sodium (COLACE) 100 MG capsule Take 1 capsule (100 mg total) by mouth 2 (two) times daily. (Patient not taking: Reported on 07/19/2017) 10 capsule 0 Not Taking  . Fe Fum-FePoly-Vit C-Vit B3 (INTEGRA) 62.5-62.5-40-3 MG CAPS Take 1 capsule by mouth daily. (Patient not taking: Reported on 07/19/2017) 30 capsule 6 Not Taking  . fluticasone (FLONASE) 50 MCG/ACT nasal spray Place 2 sprays into both nostrils daily. (Patient not taking: Reported on 07/19/2017) 16 g 0 Not Taking  . ibuprofen (ADVIL,MOTRIN) 800 MG tablet Take 1 tablet (800 mg total) by mouth 3 (three) times daily with meals as needed for headache or moderate pain. (Patient not taking: Reported on 07/19/2017) 30 tablet 1 Not Taking  .  oxyCODONE-acetaminophen (PERCOCET/ROXICET) 5-325 MG tablet Take 1-2 tablets by mouth every 4 (four) hours as needed (moderate to severe pain (when tolerating fluids)). (Patient not taking: Reported on 07/19/2017) 30 tablet 0 Not Taking    Review of Systems  Constitutional: Negative.   Respiratory: Negative.   Cardiovascular: Negative.   Gastrointestinal: Positive for abdominal pain.  Genitourinary: Negative.     Blood pressure 112/80, pulse 94, temperature 99.1 F (37.3 C), temperature source Oral, resp. rate 18, weight 72.2 kg (159 lb 4 oz), last  menstrual period 05/16/2017, SpO2 99 %. Physical Exam  Constitutional: She appears well-developed and well-nourished.  Cardiovascular: Normal rate, regular rhythm and normal heart sounds.   Respiratory: Effort normal and breath sounds normal.  GI: Soft. Bowel sounds are normal.  Mild tenderness, no rebound or gaurding  Genitourinary:  Genitourinary Comments: Nl EGBUS, watery blood tinged vaginal discharge noted, cuff sutures noted, fatty tissue protruding through the vaginal cuff midline    Results for orders placed or performed during the hospital encounter of 07/25/17 (from the past 24 hour(s))  Comprehensive metabolic panel     Status: None   Collection Time: 07/25/17  6:11 PM  Result Value Ref Range   Sodium 137 135 - 145 mmol/L   Potassium 3.7 3.5 - 5.1 mmol/L   Chloride 104 101 - 111 mmol/L   CO2 23 22 - 32 mmol/L   Glucose, Bld 95 65 - 99 mg/dL   BUN 8 6 - 20 mg/dL   Creatinine, Ser 0.61 0.44 - 1.00 mg/dL   Calcium 9.0 8.9 - 10.3 mg/dL   Total Protein 6.9 6.5 - 8.1 g/dL   Albumin 3.8 3.5 - 5.0 g/dL   AST 19 15 - 41 U/L   ALT 17 14 - 54 U/L   Alkaline Phosphatase 52 38 - 126 U/L   Total Bilirubin 0.4 0.3 - 1.2 mg/dL   GFR calc non Af Amer >60 >60 mL/min   GFR calc Af Amer >60 >60 mL/min   Anion gap 10 5 - 15  CBC     Status: Abnormal   Collection Time: 07/25/17  6:11 PM  Result Value Ref Range   WBC 14.7 (H) 4.0 - 10.5 K/uL   RBC 4.35 3.87 - 5.11 MIL/uL   Hemoglobin 11.7 (L) 12.0 - 15.0 g/dL   HCT 35.4 (L) 36.0 - 46.0 %   MCV 81.4 78.0 - 100.0 fL   MCH 26.9 26.0 - 34.0 pg   MCHC 33.1 30.0 - 36.0 g/dL   RDW 17.6 (H) 11.5 - 15.5 %   Platelets 447 (H) 150 - 400 K/uL    No results found.  Assessment/Plan: Vaginal cuff dehiscence   Will check CT scan abd/pelvis. Will proceed to OR for repair once results return. Risks/post op care of surgery reviewed with pt.   Chancy Milroy 07/25/2017, 7:50 PM

## 2017-07-25 NOTE — MAU Note (Signed)
Had relations around 130.  Started having pain and /3bleeding.  Had LAVH on 7/3.   Almost passed out from pain.

## 2017-07-25 NOTE — MAU Note (Signed)
Urine in lab 

## 2017-07-26 ENCOUNTER — Inpatient Hospital Stay (HOSPITAL_COMMUNITY): Payer: BLUE CROSS/BLUE SHIELD | Admitting: Anesthesiology

## 2017-07-26 ENCOUNTER — Encounter (HOSPITAL_COMMUNITY)
Admission: AD | Disposition: A | Discharge status: Home / Self Care | Payer: Self-pay | Source: Ambulatory Visit | Attending: Obstetrics and Gynecology

## 2017-07-26 ENCOUNTER — Encounter (HOSPITAL_COMMUNITY): Payer: Self-pay | Admitting: Anesthesiology

## 2017-07-26 DIAGNOSIS — Z90710 Acquired absence of both cervix and uterus: Secondary | ICD-10-CM | POA: Diagnosis not present

## 2017-07-26 DIAGNOSIS — T8131XA Disruption of external operation (surgical) wound, not elsewhere classified, initial encounter: Secondary | ICD-10-CM | POA: Diagnosis present

## 2017-07-26 DIAGNOSIS — N939 Abnormal uterine and vaginal bleeding, unspecified: Secondary | ICD-10-CM | POA: Diagnosis not present

## 2017-07-26 DIAGNOSIS — T8132XA Disruption of internal operation (surgical) wound, not elsewhere classified, initial encounter: Secondary | ICD-10-CM | POA: Diagnosis not present

## 2017-07-26 DIAGNOSIS — Y838 Other surgical procedures as the cause of abnormal reaction of the patient, or of later complication, without mention of misadventure at the time of the procedure: Secondary | ICD-10-CM | POA: Diagnosis not present

## 2017-07-26 DIAGNOSIS — Z86711 Personal history of pulmonary embolism: Secondary | ICD-10-CM | POA: Diagnosis not present

## 2017-07-26 DIAGNOSIS — N898 Other specified noninflammatory disorders of vagina: Secondary | ICD-10-CM | POA: Diagnosis not present

## 2017-07-26 DIAGNOSIS — D251 Intramural leiomyoma of uterus: Secondary | ICD-10-CM | POA: Diagnosis not present

## 2017-07-26 DIAGNOSIS — R102 Pelvic and perineal pain: Secondary | ICD-10-CM | POA: Diagnosis not present

## 2017-07-26 HISTORY — PX: REPAIR VAGINAL CUFF: SHX6067

## 2017-07-26 LAB — CBC
HCT: 36.1 % (ref 36.0–46.0)
Hemoglobin: 11.9 g/dL — ABNORMAL LOW (ref 12.0–15.0)
MCH: 27.1 pg (ref 26.0–34.0)
MCHC: 33 g/dL (ref 30.0–36.0)
MCV: 82.2 fL (ref 78.0–100.0)
PLATELETS: 462 10*3/uL — AB (ref 150–400)
RBC: 4.39 MIL/uL (ref 3.87–5.11)
RDW: 17.8 % — AB (ref 11.5–15.5)
WBC: 9.2 10*3/uL (ref 4.0–10.5)

## 2017-07-26 SURGERY — REPAIR, VAGINAL CUFF
Anesthesia: General | Site: Vagina

## 2017-07-26 MED ORDER — DIPHENHYDRAMINE HCL 50 MG/ML IJ SOLN
INTRAMUSCULAR | Status: AC
  Filled 2017-07-26: qty 1

## 2017-07-26 MED ORDER — ONDANSETRON HCL 4 MG/2ML IJ SOLN: 4.0000 mg | Freq: Four times a day (QID) | INTRAMUSCULAR | Status: DC | PRN

## 2017-07-26 MED ORDER — OXYCODONE-ACETAMINOPHEN 5-325 MG PO TABS: 1.0000 | ORAL_TABLET | ORAL | Status: DC | PRN

## 2017-07-26 MED ORDER — SUCCINYLCHOLINE CHLORIDE 20 MG/ML IJ SOLN
INTRAMUSCULAR | Status: DC | PRN
  Administered 2017-07-26: 100 mg via INTRAVENOUS

## 2017-07-26 MED ORDER — OXYCODONE HCL 5 MG PO TABS
ORAL_TABLET | ORAL | Status: AC
  Administered 2017-07-26: 5 mg via ORAL
  Filled 2017-07-26: qty 1

## 2017-07-26 MED ORDER — OXYCODONE HCL 5 MG PO TABS
5.0000 mg | ORAL_TABLET | Freq: Once | ORAL | Status: DC | PRN
  Administered 2017-07-26: 5 mg via ORAL

## 2017-07-26 MED ORDER — OXYCODONE HCL 5 MG/5ML PO SOLN: 5.0000 mg | Freq: Once | ORAL | Status: DC | PRN

## 2017-07-26 MED ORDER — METRONIDAZOLE IN NACL 5-0.79 MG/ML-% IV SOLN
500.0000 mg | INTRAVENOUS | Status: AC
  Administered 2017-07-26: 500 mg via INTRAVENOUS
  Filled 2017-07-26: qty 100

## 2017-07-26 MED ORDER — FENTANYL CITRATE (PF) 250 MCG/5ML IJ SOLN
INTRAMUSCULAR | Status: AC
  Filled 2017-07-26: qty 5

## 2017-07-26 MED ORDER — ROCURONIUM BROMIDE 100 MG/10ML IV SOLN
INTRAVENOUS | Status: DC | PRN
  Administered 2017-07-26: 5 mg via INTRAVENOUS

## 2017-07-26 MED ORDER — CIPROFLOXACIN IN D5W 400 MG/200ML IV SOLN
400.0000 mg | INTRAVENOUS | Status: AC
Start: 2017-07-26 — End: 2017-07-26
  Administered 2017-07-26: 400 mg via INTRAVENOUS
  Filled 2017-07-26: qty 200

## 2017-07-26 MED ORDER — MIDAZOLAM HCL 2 MG/2ML IJ SOLN
INTRAMUSCULAR | Status: DC | PRN
  Administered 2017-07-26: 2 mg via INTRAVENOUS

## 2017-07-26 MED ORDER — SIMETHICONE 80 MG PO CHEW: 80.0000 mg | CHEWABLE_TABLET | Freq: Four times a day (QID) | ORAL | Status: DC | PRN

## 2017-07-26 MED ORDER — IBUPROFEN 800 MG PO TABS: 800.0000 mg | ORAL_TABLET | Freq: Three times a day (TID) | ORAL | Status: DC | PRN

## 2017-07-26 MED ORDER — LACTATED RINGERS IV SOLN
INTRAVENOUS | Status: DC | PRN
  Administered 2017-07-26 (×2): via INTRAVENOUS

## 2017-07-26 MED ORDER — ONDANSETRON HCL 4 MG PO TABS: 4.0000 mg | ORAL_TABLET | Freq: Four times a day (QID) | ORAL | Status: DC | PRN

## 2017-07-26 MED ORDER — HYDROMORPHONE HCL 1 MG/ML IJ SOLN: 0.2000 mg | INTRAMUSCULAR | Status: DC | PRN

## 2017-07-26 MED ORDER — FENTANYL CITRATE (PF) 100 MCG/2ML IJ SOLN
INTRAMUSCULAR | Status: DC | PRN
  Administered 2017-07-26: 50 ug via INTRAVENOUS
  Administered 2017-07-26 (×2): 100 ug via INTRAVENOUS

## 2017-07-26 MED ORDER — ONDANSETRON HCL 4 MG/2ML IJ SOLN
INTRAMUSCULAR | Status: DC | PRN
  Administered 2017-07-26: 4 mg via INTRAVENOUS

## 2017-07-26 MED ORDER — METRONIDAZOLE IN NACL 5-0.79 MG/ML-% IV SOLN
500.0000 mg | Freq: Three times a day (TID) | INTRAVENOUS | Status: DC
  Filled 2017-07-26: qty 100

## 2017-07-26 MED ORDER — PROPOFOL 10 MG/ML IV BOLUS
INTRAVENOUS | Status: DC | PRN
  Administered 2017-07-26: 200 mg via INTRAVENOUS

## 2017-07-26 MED ORDER — LIDOCAINE HCL (CARDIAC) 20 MG/ML IV SOLN
INTRAVENOUS | Status: DC | PRN
  Administered 2017-07-26: 30 mg via INTRAVENOUS

## 2017-07-26 MED ORDER — MIDAZOLAM HCL 2 MG/2ML IJ SOLN
INTRAMUSCULAR | Status: AC
  Filled 2017-07-26: qty 2

## 2017-07-26 MED ORDER — FENTANYL CITRATE (PF) 100 MCG/2ML IJ SOLN: 25.0000 ug | INTRAMUSCULAR | Status: DC | PRN

## 2017-07-26 MED ORDER — DEXAMETHASONE SODIUM PHOSPHATE 10 MG/ML IJ SOLN
INTRAMUSCULAR | Status: DC | PRN
  Administered 2017-07-26: 10 mg via INTRAVENOUS

## 2017-07-26 MED ORDER — LACTATED RINGERS IV SOLN: INTRAVENOUS | Status: DC

## 2017-07-26 MED ORDER — DIPHENHYDRAMINE HCL 50 MG/ML IJ SOLN
INTRAMUSCULAR | Status: DC | PRN
  Administered 2017-07-26: 25 mg via INTRAVENOUS

## 2017-07-26 MED ORDER — CIPROFLOXACIN IN D5W 400 MG/200ML IV SOLN
400.0000 mg | Freq: Two times a day (BID) | INTRAVENOUS | Status: DC
  Filled 2017-07-26: qty 200

## 2017-07-26 MED ORDER — KETOROLAC TROMETHAMINE 30 MG/ML IJ SOLN
INTRAMUSCULAR | Status: DC | PRN
  Administered 2017-07-26: 30 mg via INTRAVENOUS

## 2017-07-26 SURGICAL SUPPLY — 13 items
CLOTH BEACON ORANGE TIMEOUT ST (SAFETY) ×2 IMPLANT
GLOVE BIO SURGEON STRL SZ7.5 (GLOVE) ×2 IMPLANT
GLOVE INDICATOR 6.5 STRL GRN (GLOVE) ×2 IMPLANT
GLOVE INDICATOR 7.0 STRL GRN (GLOVE) ×2 IMPLANT
GOWN SRG LRG LVL 3 NONREINFORC (GOWNS) ×1 IMPLANT
GOWN SRG XL LVL 3 REINF SET IN (GOWNS) ×1 IMPLANT
GOWN STRL NON-REIN LRG LVL3 (GOWNS) ×1
GOWN STRL REIN XL LVL3 (GOWNS) ×1
PACK VAGINAL MINOR WOMEN LF (CUSTOM PROCEDURE TRAY) ×2 IMPLANT
SUT VIC AB 2-0 CT1 18 (SUTURE) ×4 IMPLANT
SUT VIC AB 2-0 CT1 27 (SUTURE) ×2
SUT VIC AB 2-0 CT1 TAPERPNT 27 (SUTURE) ×2 IMPLANT
TOWEL OR 17X26 10 PK STRL BLUE (TOWEL DISPOSABLE) ×4 IMPLANT

## 2017-07-26 NOTE — Transfer of Care (Signed)
Immediate Anesthesia Transfer of Care Note  Patient: Crystal Spencer  Procedure(s) Performed: Procedure(s): REPAIR VAGINAL CUFF DEHISENCE (N/A)  Patient Location: PACU  Anesthesia Type:General  Level of Consciousness: awake, alert , oriented and patient cooperative  Airway & Oxygen Therapy: Patient Spontanous Breathing and Patient connected to nasal cannula oxygen  Post-op Assessment: Report given to RN, Post -op Vital signs reviewed and stable and Patient moving all extremities X 4  Post vital signs: Reviewed and stable  Last Vitals:  Vitals:   07/25/17 1713  BP: 112/80  Pulse: 94  Resp: 18  Temp: 37.3 C  SpO2: 99%    Last Pain:  Vitals:   07/25/17 1713  TempSrc: Oral  PainSc:          Complications: No apparent anesthesia complications

## 2017-07-26 NOTE — Anesthesia Postprocedure Evaluation (Signed)
Anesthesia Post Note  Patient: Crystal Spencer  Procedure(s) Performed: Procedure(s) (LRB): REPAIR VAGINAL CUFF DEHISENCE (N/A)     Patient location during evaluation: [facility] Anesthesia Type: General Level of consciousness: awake Pain management: satisfactory to patient Vital Signs Assessment: post-procedure vital signs reviewed and stable Respiratory status: spontaneous breathing Cardiovascular status: stable Anesthetic complications: no    Last Vitals:  Vitals:   07/26/17 0315 07/26/17 0415  BP: 90/67 (!) 90/50  Pulse: (!) 56 (!) 56  Resp: 16 16  Temp: 36.9 C 36.9 C  SpO2: 100% 98%    Last Pain:  Vitals:   07/26/17 0631  TempSrc:   PainSc: 0-No pain   Pain Goal:                 [employer]

## 2017-07-26 NOTE — Anesthesia Preprocedure Evaluation (Signed)
Anesthesia Evaluation  Patient identified by MRN, date of birth, ID band Patient awake    Reviewed: Allergy & Precautions, H&P , NPO status , Patient's Chart, lab work & pertinent test results  Airway Mallampati: II   Neck ROM: full    Dental   Pulmonary neg pulmonary ROS,    breath sounds clear to auscultation       Cardiovascular  Rhythm:regular Rate:Normal     Neuro/Psych  Headaches, PSYCHIATRIC DISORDERS Anxiety    GI/Hepatic GERD  ,  Endo/Other    Renal/GU      Musculoskeletal   Abdominal   Peds  Hematology   Anesthesia Other Findings   Reproductive/Obstetrics S/p hysterectomy                             Anesthesia Physical Anesthesia Plan  ASA: II  Anesthesia Plan: General   Post-op Pain Management:    Induction: Intravenous  PONV Risk Score and Plan: 3 and Ondansetron, Dexamethasone, Midazolam and Treatment may vary due to age or medical condition  Airway Management Planned: Oral ETT  Additional Equipment:   Intra-op Plan:   Post-operative Plan: Extubation in OR  Informed Consent: I have reviewed the patients History and Physical, chart, labs and discussed the procedure including the risks, benefits and alternatives for the proposed anesthesia with the patient or authorized representative who has indicated his/her understanding and acceptance.     Plan Discussed with: CRNA, Anesthesiologist and Surgeon  Anesthesia Plan Comments:         Anesthesia Quick Evaluation

## 2017-07-26 NOTE — Progress Notes (Signed)
Pt. Is discharged in the care of friend per ambulatory. Denies any pain or discomfort. Spirits are good understands all discharged instructions well Questions asked and answered. No excessive vaginal bleeding. No temp.

## 2017-07-26 NOTE — Discharge Summary (Signed)
Physician Discharge Summary  Patient ID: Crystal Spencer MRN: [id number] DOB/AGE: [birth year] 41 y.o.  Admit date: 07/25/2017 Discharge date:  07/26/2017   Admission Diagnoses:  Principal Problem:   Vaginal cuff dehiscence Active Problems:   Post-operative state   Discharge Diagnoses:  Same  Past Medical History:  Diagnosis Date  . Allergy   . Anemia    borderline  . Ankle pain, right   . Anxiety   . GERD (gastroesophageal reflux disease)   . Headache    Migraines  . Low blood pressure   . Lung nodule   . Numbness and tingling of both feet   . PE (pulmonary thromboembolism) ([place]) 12/29/2016  . Polycystic ovarian syndrome   . Uterine fibroid     Surgeries: Procedure(s): REPAIR VAGINAL CUFF DEHISENCE on 07/25/2017 - 07/26/2017   Consultants: None  Discharged Condition: Improved  Hospital Course: Crystal Spencer is an 41 y.o. female [place] who was admitted 07/25/2017 with a chief complaint of abdominal pain, and found to have a diagnosis of Vaginal cuff dehiscence.  They were brought to the operating room on 07/25/2017 - 07/26/2017 and underwent the above named procedures.    They were given perioperative antibiotics:  Anti-infectives    Start     Dose/Rate Route Frequency Ordered Stop   07/26/17 1000  ciprofloxacin (CIPRO) IVPB 400 mg    Comments:  First dose Pre-op   400 mg 200 mL/hr over 60 Minutes Intravenous Every 12 hours 07/26/17 0332 07/27/17 0959   07/26/17 1000  metroNIDAZOLE (FLAGYL) IVPB 500 mg    Comments:  First dose Pre-op   500 mg 100 mL/hr over 60 Minutes Intravenous Every 8 hours 07/26/17 0332 07/27/17 0959   07/26/17 0115  metroNIDAZOLE (FLAGYL) IVPB 500 mg     500 mg 100 mL/hr over 60 Minutes Intravenous On call to O.R. 07/26/17 0110 07/26/17 0221   07/26/17 0115  ciprofloxacin (CIPRO) IVPB 400 mg     400 mg 200 mL/hr over 60 Minutes Intravenous On call to O.R. 07/26/17 0110 07/26/17 0147    .  They were given sequential compression devices,  early ambulation, and SCDs for DVT prophylaxis.  They benefited maximally from their hospital stay and there were no complications.    Recent vital signs:  Vitals:   07/26/17 0415 07/26/17 0845  BP: (!) 90/50 (!) 97/57  Pulse: (!) 56 62  Resp: 16 16  Temp: 98.5 F (36.9 C) 98.9 F (37.2 C)  SpO2: 98% 99%   Discharge exam: Physical Examination: General appearance - alert, well appearing, and in no distress Chest - normal effort Heart - normal rate and regular rhythm Abdomen - soft, nontender, nondistended, no masses or organomegaly Extremities - Homan's sign negative bilaterally  Recent laboratory studies:  Results for orders placed or performed during the hospital encounter of 07/25/17  Comprehensive metabolic panel  Result Value Ref Range   Sodium 137 135 - 145 mmol/L   Potassium 3.7 3.5 - 5.1 mmol/L   Chloride 104 101 - 111 mmol/L   CO2 23 22 - 32 mmol/L   Glucose, Bld 95 65 - 99 mg/dL   BUN 8 6 - 20 mg/dL   Creatinine, Ser 0.61 0.44 - 1.00 mg/dL   Calcium 9.0 8.9 - 10.3 mg/dL   Total Protein 6.9 6.5 - 8.1 g/dL   Albumin 3.8 3.5 - 5.0 g/dL   AST 19 15 - 41 U/L   ALT 17 14 - 54 U/L   Alkaline Phosphatase 52 38 - 126  U/L   Total Bilirubin 0.4 0.3 - 1.2 mg/dL   GFR calc non Af Amer >60 >60 mL/min   GFR calc Af Amer >60 >60 mL/min   Anion gap 10 5 - 15  CBC  Result Value Ref Range   WBC 14.7 (H) 4.0 - 10.5 K/uL   RBC 4.35 3.87 - 5.11 MIL/uL   Hemoglobin 11.7 (L) 12.0 - 15.0 g/dL   HCT 35.4 (L) 36.0 - 46.0 %   MCV 81.4 78.0 - 100.0 fL   MCH 26.9 26.0 - 34.0 pg   MCHC 33.1 30.0 - 36.0 g/dL   RDW 17.6 (H) 11.5 - 15.5 %   Platelets 447 (H) 150 - 400 K/uL  CBC  Result Value Ref Range   WBC 9.2 4.0 - 10.5 K/uL   RBC 4.39 3.87 - 5.11 MIL/uL   Hemoglobin 11.9 (L) 12.0 - 15.0 g/dL   HCT 36.1 36.0 - 46.0 %   MCV 82.2 78.0 - 100.0 fL   MCH 27.1 26.0 - 34.0 pg   MCHC 33.0 30.0 - 36.0 g/dL   RDW 17.8 (H) 11.5 - 15.5 %   Platelets 462 (H) 150 - 400 K/uL  Type and  screen [place]  Result Value Ref Range   ABO/RH(D) A POS    Antibody Screen NEG    Sample Expiration 07/28/2017     Discharge Medications:   Allergies as of 07/26/2017      Reactions   Estrogens Other (See Comments)   DVT and PE on OCP's   Hydrocodone Nausea And Vomiting   Amoxicillin Rash   Has patient had a PCN reaction causing immediate rash, facial/tongue/throat swelling, SOB or lightheadedness with hypotension: Unknown Has patient had a PCN reaction causing severe rash involving mucus membranes or skin necrosis: No Has patient had a PCN reaction that required hospitalization: No Has patient had a PCN reaction occurring within the last 10 years: Yes If all of the above answers are "NO", then may proceed with Cephalosporin use.      Medication List    TAKE these medications   ALLEGRA INTENSIVE RELIEF 2-0.5 % [place] Generic drug:  Diphenhydramine-Allantoin Apply 1 application topically daily as needed (itching).   fluticasone 50 MCG/ACT nasal spray Commonly known as:  FLONASE Place 2 sprays into both nostrils daily.   ibuprofen 800 MG tablet Commonly known as:  ADVIL,MOTRIN Take 1 tablet (800 mg total) by mouth 3 (three) times daily with meals as needed for headache or moderate pain.   multivitamin with minerals Tabs tablet Take 1 tablet by mouth daily.       Diagnostic Studies: Ct Abdomen Pelvis W Contrast  Result Date: 07/25/2017 CLINICAL DATA:  Abdominopelvic pain, status post total hysterectomy on July 3rd EXAM: CT ABDOMEN AND PELVIS WITH CONTRAST TECHNIQUE: Multidetector CT imaging of the abdomen and pelvis was performed using the standard protocol following bolus administration of intravenous contrast. CONTRAST:  183mL ISOVUE-300 IOPAMIDOL (ISOVUE-300) INJECTION 61% COMPARISON:  Pelvic ultrasound dated 06/24/2017. MRI abdomen dated 05/16/2017. FINDINGS: Lower chest: 4 mm triangular subpleural lymph node at the lateral left lung base (series 4/  image 18), unchanged, benign. Hepatobiliary: Hyperenhancing lesion in the right hepatic lobe measuring approximately 1.7 cm (series 2/ image 23), better characterized as benign FNH on MRI. Additional 2.1 cm lesion with peripheral nodular discontinuous enhancement in the posterior right liver (series 2/image 30), compatible with a benign hemangioma. Gallbladder is unremarkable. No intrahepatic or extrahepatic dilatation. Pancreas: Within normal limits. Spleen: Within  normal limits. Adrenals/Urinary Tract: Adrenal glands are within normal limits. 10 mm anterior right lower pole renal cyst (series 2/ image 38). Left kidney is within normal limits. No hydronephrosis. Bladder is within normal limits. Stomach/Bowel: Stomach is within normal limits. No evidence of bowel obstruction. Appendix is not discretely visualized. Vascular/Lymphatic: No evidence of abdominal aortic aneurysm. No suspicious abdominopelvic lymphadenopathy. Reproductive: Status post hysterectomy. Bilateral ovaries are enlarged with multiple small follicles (series 2/ image 64), compatible with the clinical diagnosis of PCOS. Other: Trace pelvic fluid/ascites. No drainable fluid collection/ abscess. Mild postsurgical changes along the lower midline anterior abdominal wall. Musculoskeletal: Visualized osseous structures are within normal limits. IMPRESSION: Postsurgical changes related to hysterectomy. No drainable fluid collection/ abscess.  No free air. Enlarged bilateral ovaries with multiple small follicles, compatible with the clinical diagnosis of PCOS. 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 Electronically Signed   By: Julian Hy M.D.   On: 07/25/2017 20:18    Disposition: 01-Home or Self Care  Discharge Instructions    Call MD for:  persistant nausea and vomiting    Complete by:  As directed    Call MD for:  redness, tenderness, or signs of infection (pain, swelling, redness, odor or green/yellow discharge around  incision site)    Complete by:  As directed    Call MD for:  severe uncontrolled pain    Complete by:  As directed    Call MD for:  temperature >100.4    Complete by:  As directed    Diet - low sodium heart healthy    Complete by:  As directed    Increase activity slowly    Complete by:  As directed    Lifting restrictions    Complete by:  As directed    Nothing > 20 lbs x 4 wks   Sexual Activity Restrictions    Complete by:  As directed    None x 8 wks      Follow-up [facility] for [facility] Follow up in 2 week(s).   Specialty:  Obstetrics and Gynecology Why:  they will call you with an appointment Contact information: [place] [phone]           Signed: Donnamae Jude 07/26/2017, 10:08 AM

## 2017-07-26 NOTE — Op Note (Signed)
07/25/2017 - 07/26/2017  2:13 AM  PATIENT:  Crystal Spencer  41 y.o. female  PRE-OPERATIVE DIAGNOSIS:  vaginal cuff repair  POST-OPERATIVE DIAGNOSIS:  vaginal cuff repair  PROCEDURE:  Procedure(s): REPAIR VAGINAL CUFF DEHISENCE (N/A)  SURGEON:  Surgeon(s) and Role:    * Chancy Milroy, MD - Primary   ANESTHESIA:   general  EBL:  Total I/O In: 1000 [I.V.:1000] Out: 75 [Urine:30; Blood:25]  BLOOD ADMINISTERED:none  DRAINS: none   LOCAL MEDICATIONS USED:  NONE  SPECIMEN:  No Specimen  DISPOSITION OF SPECIMEN:  N/A  COUNTS:  YES  TOURNIQUET:  * No tourniquets in log *  DICTATION: Pt was taken to OR # 4 with above Dx. Pt underwent GETA. She was prepped and draped in the usual sterile fashion. I & O cath was performed. Time was completed. Deavers were placed ant and post exposing the vaginal cuff which was noted to completely dehiscence. There was no evidence of exposed bowel. The perineum was identified and closed in a purse string fashion with 2/0 Vicryl. The vaginal cuff edges were noted to have good healthy appearing tissue. There was no evidence of necrotic tissue. The vaginal cuff was closed ant to post with 2/0 Vicryl. The vagina was irrigated with copious amounts of irrigation. All counts were correct x 2. Pt tolerated the procedure well and was taken to PACU in stable condition.  PLAN OF CARE: Admit for overnight observation  PATIENT DISPOSITION:  PACU - hemodynamically stable.   Delay start of Pharmacological VTE agent (>24hrs) due to surgical blood loss or risk of bleeding: not applicable

## 2017-07-26 NOTE — Anesthesia Procedure Notes (Signed)
Procedure Name: Intubation Date/Time: 07/26/2017 1:24 AM Performed by: Gilmer Mor R Pre-anesthesia Checklist: Patient identified, Patient being monitored, Timeout performed, Emergency Drugs available and Suction available Patient Re-evaluated:Patient Re-evaluated prior to induction Oxygen Delivery Method: Circle System Utilized Preoxygenation: Pre-oxygenation with 100% oxygen Induction Type: IV induction Ventilation: Mask ventilation without difficulty Laryngoscope Size: Mac and 3 Grade View: Grade II Tube type: Oral Tube size: 7.0 mm Number of attempts: 1 Airway Equipment and Method: stylet Placement Confirmation: ETT inserted through vocal cords under direct vision,  positive ETCO2 and breath sounds checked- equal and bilateral Secured at: 21 cm Tube secured with: Tape Dental Injury: Teeth and Oropharynx as per pre-operative assessment

## 2017-07-26 NOTE — Discharge Instructions (Signed)
Vaginal cuff repair, Care After Refer to this sheet in the next few weeks. These instructions provide you with information on caring for yourself after your procedure. Your health care provider may also give you more specific instructions. Your treatment has been planned according to current medical practices, but problems sometimes occur. Call your health care provider if you have any problems or questions after your procedure. Follow these instructions at home:  Take frequent rest periods throughout the day.  Only take over-the-counter or prescription medicines as directed by your health care provider.  Avoid strenuous activity such as heavy lifting (more than 10 pounds [4.5 kg]), pushing, and pulling until your health care provider says it is okay.  Take showers if your health care provider approves. Pat incisions dry. Do not rub incisions with a washcloth or towel. Do not take tub baths until your health care provider approves.  Wear compression stockings as directed by your health care provider. These stockings help prevent blood clots from forming in your legs.  Talk with your health care provider about when you may return to work and your exercise routine.  Do not drive until your health care provider approves.  You may resume your normal diet. Eat a well-balanced diet.  Drink enough fluids to keep your urine clear or pale yellow.  Your normal bowel function should return. If you become constipated, you may: ? Take a mild laxative. ? Add fruit and bran to your diet. ? Drink more fluids.  Do not have sexual intercourse until permitted by your health care provider.  Follow up with your health care provider as directed. Contact a health care provider if: You have persistent nausea or vomiting. Get help right away if:  You have increased bleeding (more than a small spot) from the vaginal area.  Your pain is not relieved with medicine or becomes worse.  You have redness,  swelling, or increasing pain in the vaginal area.  You have abdominal pain.  You see pus coming from the wounds.  You develop a fever.  You have a foul smell coming from your vaginal area.  You develop light-headedness or you feel faint.  You have difficulty breathing. This information is not intended to replace advice given to you by your health care provider. Make sure you discuss any questions you have with your health care provider. Document Released: 06/10/2005 Document Revised: 04/29/2016 Document Reviewed: 04/13/2013 Elsevier Interactive Patient Education  2017 [employer].

## 2017-07-27 ENCOUNTER — Encounter (HOSPITAL_COMMUNITY): Payer: Self-pay | Admitting: Obstetrics and Gynecology

## 2017-08-12 DIAGNOSIS — Z1329 Encounter for screening for other suspected endocrine disorder: Secondary | ICD-10-CM | POA: Diagnosis not present

## 2017-08-12 DIAGNOSIS — Z0000 Encounter for general adult medical examination without abnormal findings: Secondary | ICD-10-CM | POA: Diagnosis not present

## 2017-08-12 DIAGNOSIS — Z13220 Encounter for screening for lipoid disorders: Secondary | ICD-10-CM | POA: Diagnosis not present

## 2017-08-12 DIAGNOSIS — D649 Anemia, unspecified: Secondary | ICD-10-CM | POA: Diagnosis not present

## 2017-08-12 DIAGNOSIS — Z114 Encounter for screening for human immunodeficiency virus [HIV]: Secondary | ICD-10-CM | POA: Diagnosis not present

## 2017-08-16 ENCOUNTER — Encounter: Payer: Self-pay | Admitting: *Deleted

## 2017-08-25 DIAGNOSIS — J309 Allergic rhinitis, unspecified: Secondary | ICD-10-CM | POA: Diagnosis not present

## 2017-08-25 DIAGNOSIS — J019 Acute sinusitis, unspecified: Secondary | ICD-10-CM | POA: Diagnosis not present

## 2017-08-31 ENCOUNTER — Other Ambulatory Visit: Payer: Self-pay | Admitting: Family Medicine

## 2017-08-31 DIAGNOSIS — D649 Anemia, unspecified: Secondary | ICD-10-CM | POA: Diagnosis not present

## 2017-08-31 DIAGNOSIS — Z23 Encounter for immunization: Secondary | ICD-10-CM | POA: Diagnosis not present

## 2017-08-31 DIAGNOSIS — N390 Urinary tract infection, site not specified: Secondary | ICD-10-CM | POA: Diagnosis not present

## 2017-08-31 DIAGNOSIS — Z1239 Encounter for other screening for malignant neoplasm of breast: Secondary | ICD-10-CM

## 2017-08-31 DIAGNOSIS — Z6827 Body mass index (BMI) 27.0-27.9, adult: Secondary | ICD-10-CM | POA: Diagnosis not present

## 2017-08-31 DIAGNOSIS — Z0000 Encounter for general adult medical examination without abnormal findings: Secondary | ICD-10-CM | POA: Diagnosis not present

## 2017-08-31 DIAGNOSIS — L309 Dermatitis, unspecified: Secondary | ICD-10-CM | POA: Diagnosis not present

## 2017-09-09 ENCOUNTER — Ambulatory Visit
Admission: RE | Admit: 2017-09-09 | Discharge: 2017-09-09 | Disposition: A | Discharge status: Home / Self Care | Payer: BLUE CROSS/BLUE SHIELD | Source: Ambulatory Visit | Attending: Family Medicine | Admitting: Family Medicine

## 2017-09-09 DIAGNOSIS — Z1231 Encounter for screening mammogram for malignant neoplasm of breast: Secondary | ICD-10-CM | POA: Diagnosis not present

## 2017-09-09 DIAGNOSIS — Z1239 Encounter for other screening for malignant neoplasm of breast: Secondary | ICD-10-CM

## 2017-09-13 ENCOUNTER — Other Ambulatory Visit: Payer: Self-pay | Admitting: Family Medicine

## 2017-09-13 DIAGNOSIS — R928 Other abnormal and inconclusive findings on diagnostic imaging of breast: Secondary | ICD-10-CM

## 2017-09-16 ENCOUNTER — Ambulatory Visit
Admission: RE | Admit: 2017-09-16 | Discharge: 2017-09-16 | Disposition: A | Discharge status: Home / Self Care | Payer: BLUE CROSS/BLUE SHIELD | Source: Ambulatory Visit | Attending: Family Medicine | Admitting: Family Medicine

## 2017-09-16 ENCOUNTER — Other Ambulatory Visit: Payer: Self-pay | Admitting: Family Medicine

## 2017-09-16 DIAGNOSIS — N6489 Other specified disorders of breast: Secondary | ICD-10-CM | POA: Diagnosis not present

## 2017-09-16 DIAGNOSIS — R928 Other abnormal and inconclusive findings on diagnostic imaging of breast: Secondary | ICD-10-CM

## 2017-09-16 DIAGNOSIS — R922 Inconclusive mammogram: Secondary | ICD-10-CM | POA: Diagnosis not present

## 2017-09-16 DIAGNOSIS — N6320 Unspecified lump in the left breast, unspecified quadrant: Secondary | ICD-10-CM

## 2017-09-27 ENCOUNTER — Other Ambulatory Visit: Payer: BLUE CROSS/BLUE SHIELD

## 2017-09-28 DIAGNOSIS — Z23 Encounter for immunization: Secondary | ICD-10-CM | POA: Diagnosis not present

## 2017-09-29 ENCOUNTER — Other Ambulatory Visit: Payer: BLUE CROSS/BLUE SHIELD

## 2017-10-03 ENCOUNTER — Ambulatory Visit
Admission: RE | Admit: 2017-10-03 | Discharge: 2017-10-03 | Disposition: A | Discharge status: Home / Self Care | Payer: BLUE CROSS/BLUE SHIELD | Source: Ambulatory Visit | Attending: Family Medicine | Admitting: Family Medicine

## 2017-10-03 ENCOUNTER — Other Ambulatory Visit: Payer: Self-pay | Admitting: Family Medicine

## 2017-10-03 DIAGNOSIS — N6320 Unspecified lump in the left breast, unspecified quadrant: Secondary | ICD-10-CM

## 2017-10-03 DIAGNOSIS — D242 Benign neoplasm of left breast: Secondary | ICD-10-CM | POA: Diagnosis not present

## 2017-10-03 DIAGNOSIS — N6322 Unspecified lump in the left breast, upper inner quadrant: Secondary | ICD-10-CM | POA: Diagnosis not present

## 2017-10-03 DIAGNOSIS — N6321 Unspecified lump in the left breast, upper outer quadrant: Secondary | ICD-10-CM | POA: Diagnosis not present

## 2017-10-03 DIAGNOSIS — N6012 Diffuse cystic mastopathy of left breast: Secondary | ICD-10-CM | POA: Diagnosis not present

## 2017-10-07 ENCOUNTER — Ambulatory Visit
Admission: RE | Admit: 2017-10-07 | Discharge: 2017-10-07 | Disposition: A | Discharge status: Home / Self Care | Payer: BLUE CROSS/BLUE SHIELD | Source: Ambulatory Visit | Attending: Family Medicine | Admitting: Family Medicine

## 2017-10-07 ENCOUNTER — Other Ambulatory Visit: Payer: Self-pay | Admitting: Family Medicine

## 2017-10-07 DIAGNOSIS — R05 Cough: Secondary | ICD-10-CM

## 2017-10-07 DIAGNOSIS — R059 Cough, unspecified: Secondary | ICD-10-CM

## 2017-10-07 DIAGNOSIS — Z6826 Body mass index (BMI) 26.0-26.9, adult: Secondary | ICD-10-CM | POA: Diagnosis not present

## 2017-10-07 DIAGNOSIS — J069 Acute upper respiratory infection, unspecified: Secondary | ICD-10-CM | POA: Diagnosis not present

## 2017-10-18 DIAGNOSIS — J069 Acute upper respiratory infection, unspecified: Secondary | ICD-10-CM | POA: Diagnosis not present

## 2017-10-18 DIAGNOSIS — Z23 Encounter for immunization: Secondary | ICD-10-CM | POA: Diagnosis not present

## 2017-10-18 DIAGNOSIS — J0100 Acute maxillary sinusitis, unspecified: Secondary | ICD-10-CM | POA: Diagnosis not present

## 2017-12-23 ENCOUNTER — Encounter: Payer: Self-pay | Admitting: Obstetrics & Gynecology

## 2017-12-23 ENCOUNTER — Ambulatory Visit (HOSPITAL_BASED_OUTPATIENT_CLINIC_OR_DEPARTMENT_OTHER)
Admission: RE | Admit: 2017-12-23 | Discharge: 2017-12-23 | Disposition: A | Discharge status: Home / Self Care | Payer: BLUE CROSS/BLUE SHIELD | Source: Ambulatory Visit | Attending: Obstetrics & Gynecology | Admitting: Obstetrics & Gynecology

## 2017-12-23 ENCOUNTER — Ambulatory Visit: Payer: BLUE CROSS/BLUE SHIELD | Admitting: Obstetrics & Gynecology

## 2017-12-23 ENCOUNTER — Encounter (HOSPITAL_BASED_OUTPATIENT_CLINIC_OR_DEPARTMENT_OTHER): Payer: Self-pay

## 2017-12-23 VITALS — BP 101/57 | HR 67 | Ht 64.0 in | Wt 155.8 lb

## 2017-12-23 DIAGNOSIS — R5383 Other fatigue: Secondary | ICD-10-CM | POA: Diagnosis not present

## 2017-12-23 DIAGNOSIS — Z90710 Acquired absence of both cervix and uterus: Secondary | ICD-10-CM | POA: Insufficient documentation

## 2017-12-23 DIAGNOSIS — N939 Abnormal uterine and vaginal bleeding, unspecified: Secondary | ICD-10-CM

## 2017-12-23 DIAGNOSIS — R102 Pelvic and perineal pain: Secondary | ICD-10-CM

## 2017-12-23 DIAGNOSIS — Z113 Encounter for screening for infections with a predominantly sexual mode of transmission: Secondary | ICD-10-CM | POA: Diagnosis not present

## 2017-12-23 LAB — POCT URINALYSIS DIP (DEVICE)
Bilirubin Urine: NEGATIVE
GLUCOSE, UA: NEGATIVE mg/dL
HGB URINE DIPSTICK: NEGATIVE
Ketones, ur: NEGATIVE mg/dL
Leukocytes, UA: NEGATIVE
Nitrite: NEGATIVE
PROTEIN: NEGATIVE mg/dL
SPECIFIC GRAVITY, URINE: 1.025 (ref 1.005–1.030)
Urobilinogen, UA: 0.2 mg/dL (ref 0.0–1.0)
pH: 6.5 (ref 5.0–8.0)

## 2017-12-23 NOTE — Progress Notes (Signed)
History:  42 y.o. G0P0000 here today for eval of lower abd pain. Pt is s/p RATH with bilateral salpingectomy 06/07/2017. Her post op course was complicated by a vaginal cuff cellulitis after early intercourse followed by a cuff dehiscence which also followed intercourse.    Pt reports 1 month freq urination and small voids.  Pt reports that she has not increased her caffeine intake.  Pt was only sexually active once since surgery.   She reports 2 days of spotting that has resolved.   Pt reports a significant amount of fatigue. She reports that she can never get enough rest.    The following portions of the patient's history were reviewed and updated as appropriate: allergies, current medications, past family history, past medical history, past social history, past surgical history and problem list.  Review of Systems:  Pertinent items are noted in HPI.   Objective:  Physical Exam Weight 155 lb 12.8 oz (70.7 kg), last menstrual period 05/16/2017.  CONSTITUTIONAL: Well-developed, well-nourished female in no acute distress.  HENT:  Normocephalic, atraumatic EYES: Conjunctivae and EOM are normal. No scleral icterus.  NECK: Normal range of motion SKIN: Skin is warm and dry. No rash noted. Not diaphoretic.No pallor. [place]: Alert and oriented to person, place, and time. Normal coordination.  Abd: Soft, nontender and nondistended Pelvic: Normal appearing external genitalia; normal appearing vaginal cuff.  Normal discharge. The vaginal cuff is well healed and there are no lesions. The uterus and cervix are surgically absent. There is bilateral adnexal tenderness the ovaries are palpable bilaterally and mobile.      Assessment & Plan:  Pelvic pain with adnexal tenderness  Will send urine for STI screen and cx  Pelvic US  Rec OTC Motrin prn for pain.   Fatigue  Labs: TSH and CBC  F/u in 6 weeks or sooner prn  Total face-to-face time with patient was 20 min.  Greater than 50% was spent in  counseling and coordination of care with the patient.   Frederik Standley L. Harraway-Smith, M.D., [place] June

## 2017-12-24 LAB — CBC
HEMOGLOBIN: 11.9 g/dL (ref 11.1–15.9)
Hematocrit: 36.6 % (ref 34.0–46.6)
MCH: 28.5 pg (ref 26.6–33.0)
MCHC: 32.5 g/dL (ref 31.5–35.7)
MCV: 88 fL (ref 79–97)
PLATELETS: 481 10*3/uL — AB (ref 150–379)
RBC: 4.17 x10E6/uL (ref 3.77–5.28)
RDW: 14.5 % (ref 12.3–15.4)
WBC: 4.9 10*3/uL (ref 3.4–10.8)

## 2017-12-24 LAB — TSH: TSH: 1.65 u[IU]/mL (ref 0.450–4.500)

## 2017-12-25 LAB — URINE CULTURE: ORGANISM ID, BACTERIA: NO GROWTH

## 2017-12-26 ENCOUNTER — Ambulatory Visit (HOSPITAL_COMMUNITY): Payer: BLUE CROSS/BLUE SHIELD

## 2017-12-26 LAB — CERVICOVAGINAL ANCILLARY ONLY
Chlamydia: NEGATIVE
NEISSERIA GONORRHEA: NEGATIVE

## 2018-01-30 DIAGNOSIS — H5789 Other specified disorders of eye and adnexa: Secondary | ICD-10-CM | POA: Diagnosis not present

## 2018-02-13 ENCOUNTER — Ambulatory Visit: Payer: BLUE CROSS/BLUE SHIELD | Admitting: Obstetrics & Gynecology

## 2018-04-23 IMAGING — CR DG CHEST 2V
2 series · 2 of 2 positions shown · non-contrast
Comparison: CT of the chest 04/11/2017.

CLINICAL DATA: Cough, fever, rhinorrhea. Symptoms for 10 days.
Initial encounter.

EXAM:
CHEST  2 VIEW

[w chest pa]
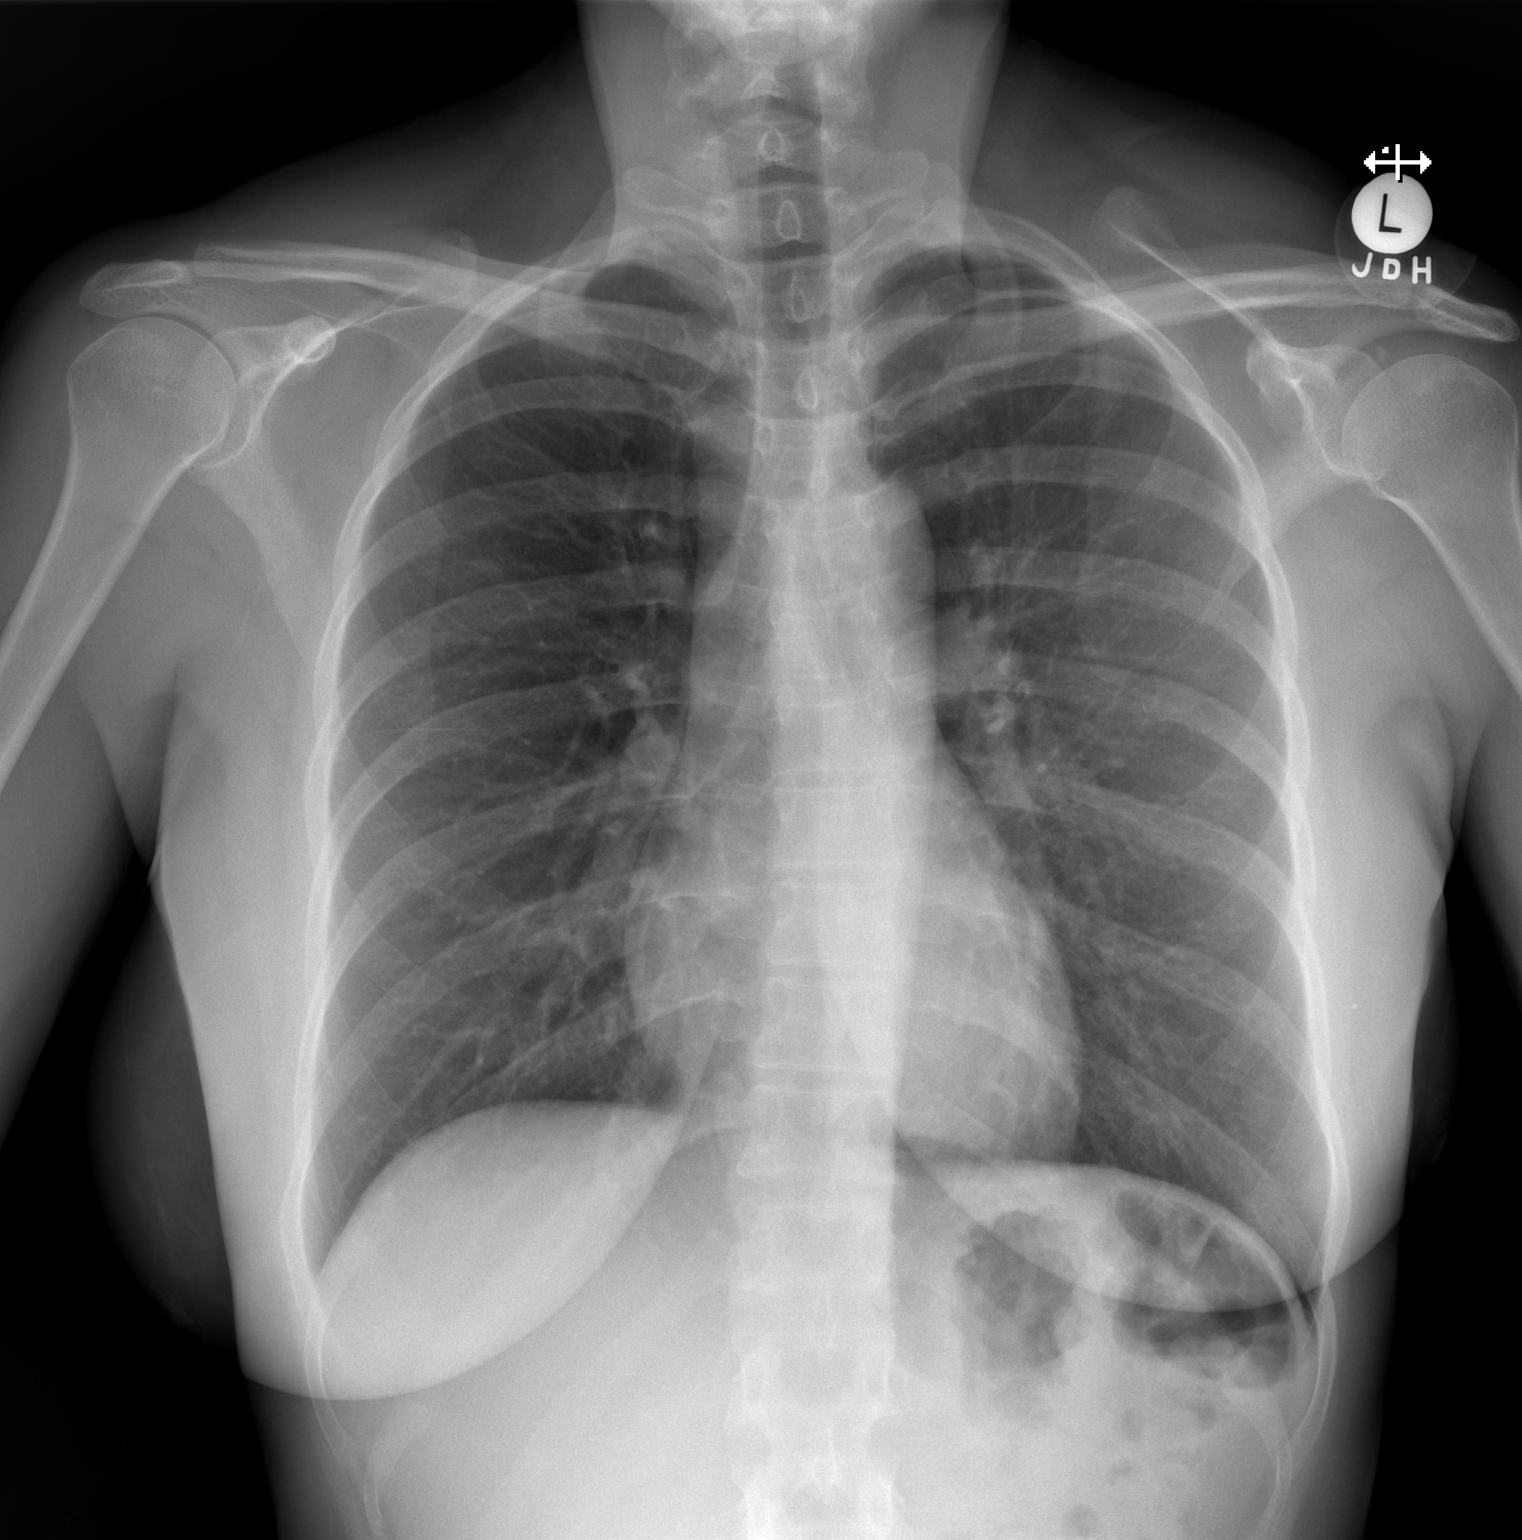

[w chest lat]
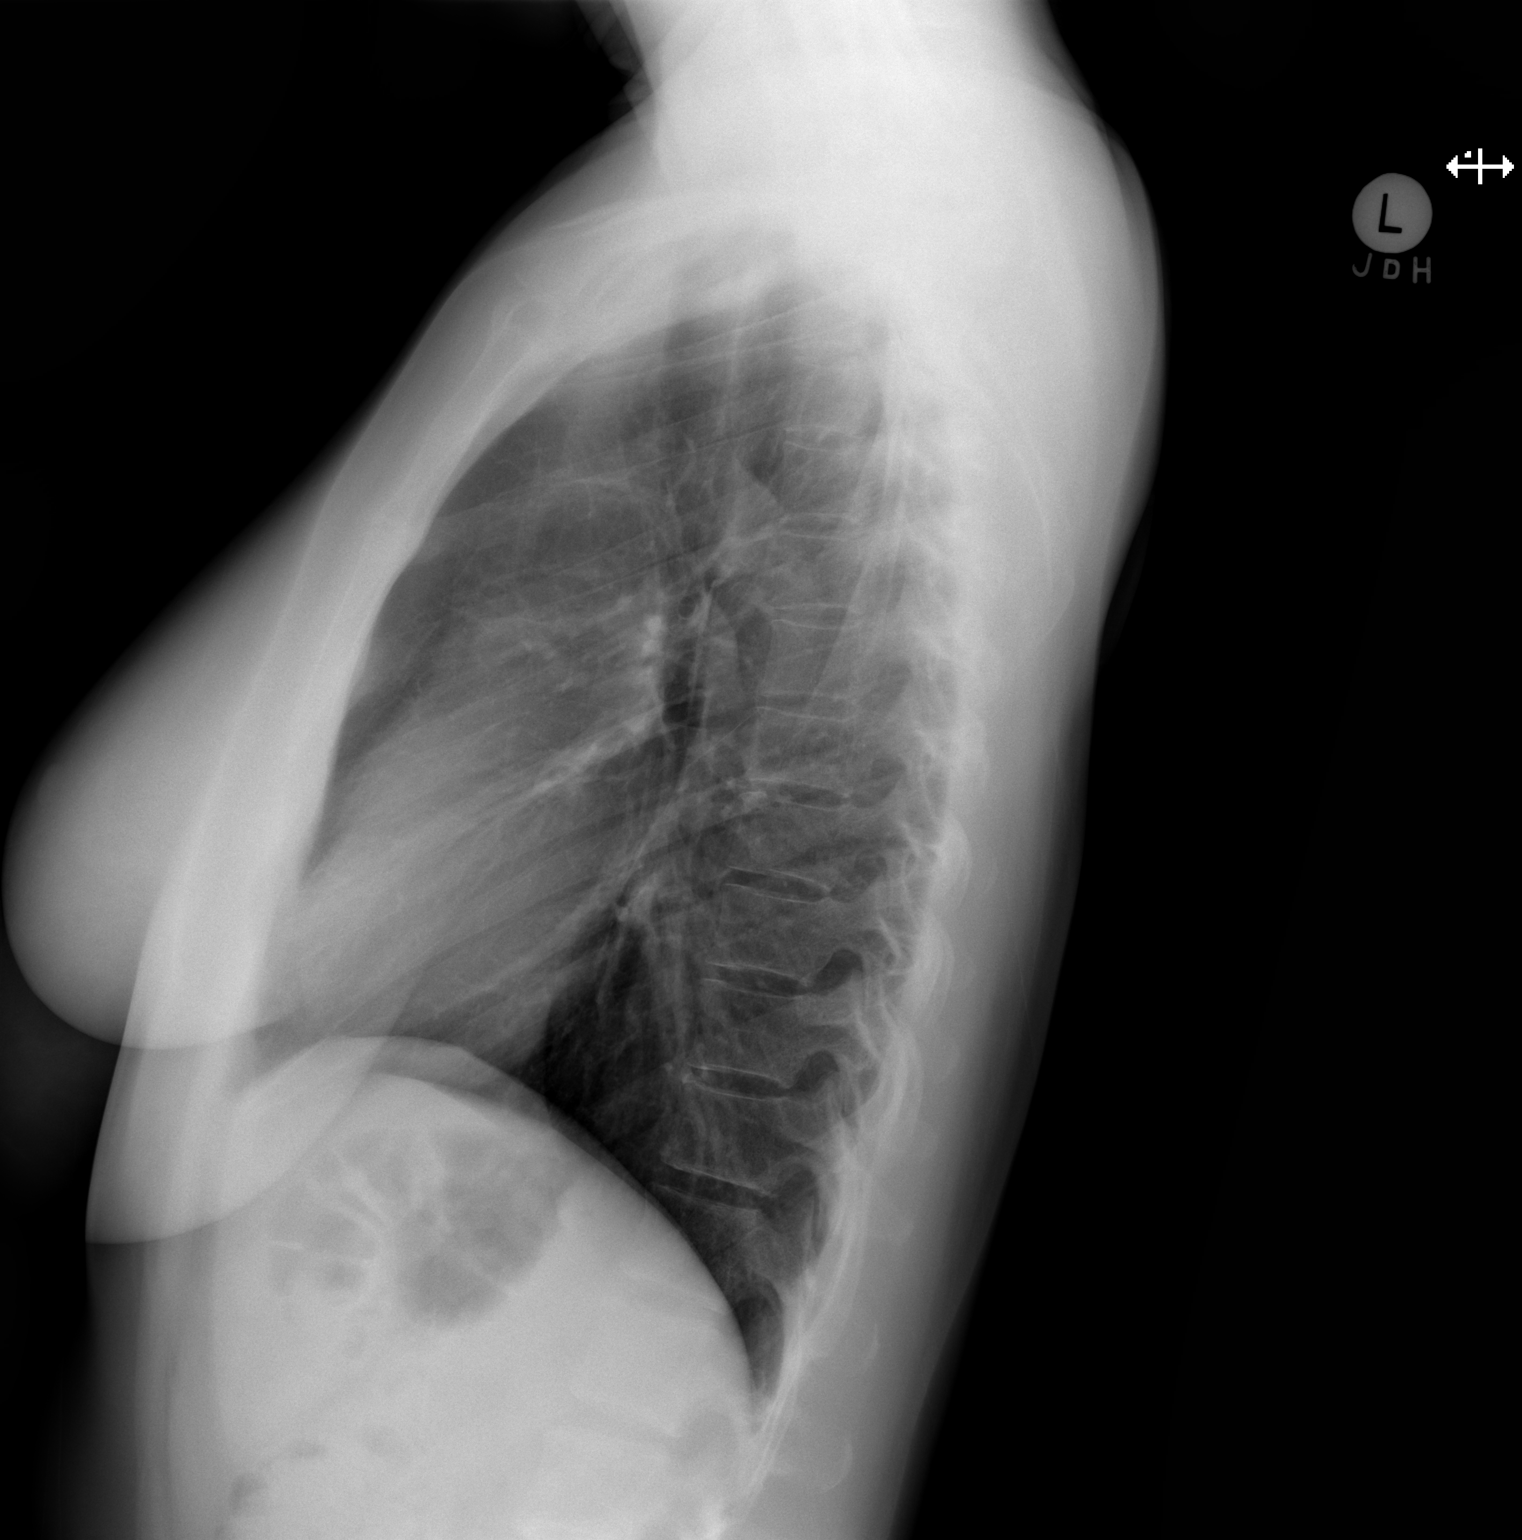

[2 of 2 positions shown; findings below may reference images not displayed]

FINDINGS: The heart size and mediastinal contours are within normal limits.
Both lungs are clear. The visualized skeletal structures are
unremarkable.
IMPRESSION: Negative two view chest x-ray.

## 2018-05-15 DIAGNOSIS — N898 Other specified noninflammatory disorders of vagina: Secondary | ICD-10-CM | POA: Diagnosis not present

## 2018-07-18 DIAGNOSIS — D2261 Melanocytic nevi of right upper limb, including shoulder: Secondary | ICD-10-CM | POA: Diagnosis not present

## 2018-10-17 DIAGNOSIS — R350 Frequency of micturition: Secondary | ICD-10-CM | POA: Diagnosis not present

## 2018-10-17 DIAGNOSIS — N941 Unspecified dyspareunia: Secondary | ICD-10-CM | POA: Diagnosis not present

## 2018-10-17 DIAGNOSIS — R5383 Other fatigue: Secondary | ICD-10-CM | POA: Diagnosis not present

## 2018-11-22 DIAGNOSIS — J029 Acute pharyngitis, unspecified: Secondary | ICD-10-CM | POA: Diagnosis not present

## 2018-11-22 DIAGNOSIS — R6883 Chills (without fever): Secondary | ICD-10-CM | POA: Diagnosis not present

## 2019-02-07 DIAGNOSIS — L814 Other melanin hyperpigmentation: Secondary | ICD-10-CM | POA: Diagnosis not present

## 2019-02-07 DIAGNOSIS — I781 Nevus, non-neoplastic: Secondary | ICD-10-CM | POA: Diagnosis not present

## 2019-02-07 DIAGNOSIS — D225 Melanocytic nevi of trunk: Secondary | ICD-10-CM | POA: Diagnosis not present

## 2019-02-07 DIAGNOSIS — L821 Other seborrheic keratosis: Secondary | ICD-10-CM | POA: Diagnosis not present

## 2019-02-07 DIAGNOSIS — D239 Other benign neoplasm of skin, unspecified: Secondary | ICD-10-CM | POA: Diagnosis not present

## 2019-02-07 DIAGNOSIS — D489 Neoplasm of uncertain behavior, unspecified: Secondary | ICD-10-CM | POA: Diagnosis not present
